# Patient Record
Sex: Male | Born: 1960 | Race: White | Hispanic: No | Smoking: Never smoker
Health system: Southern US, Community
[De-identification: ages and names within clinical notes are randomized; demographics above are authoritative.]

## PROBLEM LIST (undated history)

## (undated) DIAGNOSIS — T8859XA Other complications of anesthesia, initial encounter: Secondary | ICD-10-CM

## (undated) DIAGNOSIS — M1990 Unspecified osteoarthritis, unspecified site: Secondary | ICD-10-CM

## (undated) DIAGNOSIS — E119 Type 2 diabetes mellitus without complications: Secondary | ICD-10-CM

## (undated) DIAGNOSIS — F32A Depression, unspecified: Secondary | ICD-10-CM

## (undated) DIAGNOSIS — F909 Attention-deficit hyperactivity disorder, unspecified type: Secondary | ICD-10-CM

## (undated) DIAGNOSIS — E785 Hyperlipidemia, unspecified: Secondary | ICD-10-CM

## (undated) DIAGNOSIS — K219 Gastro-esophageal reflux disease without esophagitis: Secondary | ICD-10-CM

---

## 1999-03-04 ENCOUNTER — Encounter: Payer: Self-pay | Admitting: Emergency Medicine

## 1999-03-04 ENCOUNTER — Observation Stay (HOSPITAL_COMMUNITY): Admission: EM | Admit: 1999-03-04 | Discharge: 1999-03-05 | Payer: Self-pay | Admitting: Emergency Medicine

## 2005-05-27 ENCOUNTER — Encounter: Admission: RE | Admit: 2005-05-27 | Discharge: 2005-05-27 | Payer: Self-pay | Admitting: Specialist

## 2005-06-09 ENCOUNTER — Ambulatory Visit (HOSPITAL_BASED_OUTPATIENT_CLINIC_OR_DEPARTMENT_OTHER): Admission: RE | Admit: 2005-06-09 | Discharge: 2005-06-09 | Payer: Self-pay | Admitting: Orthopedic Surgery

## 2005-06-09 ENCOUNTER — Ambulatory Visit (HOSPITAL_COMMUNITY): Admission: RE | Admit: 2005-06-09 | Discharge: 2005-06-09 | Payer: Self-pay | Admitting: Orthopedic Surgery

## 2017-09-07 ENCOUNTER — Encounter (INDEPENDENT_AMBULATORY_CARE_PROVIDER_SITE_OTHER): Payer: Self-pay | Admitting: Orthopaedic Surgery

## 2017-09-07 ENCOUNTER — Ambulatory Visit (INDEPENDENT_AMBULATORY_CARE_PROVIDER_SITE_OTHER): Payer: Non-veteran care | Admitting: Orthopaedic Surgery

## 2017-09-07 DIAGNOSIS — M25511 Pain in right shoulder: Secondary | ICD-10-CM | POA: Diagnosis not present

## 2017-09-07 DIAGNOSIS — G8929 Other chronic pain: Secondary | ICD-10-CM | POA: Diagnosis not present

## 2017-09-07 MED ORDER — LIDOCAINE HCL 1 % IJ SOLN
3.0000 mL | INTRAMUSCULAR | Status: AC | PRN
  Administered 2017-09-07: 3 mL

## 2017-09-07 MED ORDER — BUPIVACAINE HCL 0.5 % IJ SOLN
3.0000 mL | INTRAMUSCULAR | Status: AC | PRN
  Administered 2017-09-07: 3 mL via INTRA_ARTICULAR

## 2017-09-07 MED ORDER — METHYLPREDNISOLONE ACETATE 40 MG/ML IJ SUSP
40.0000 mg | INTRAMUSCULAR | Status: AC | PRN
  Administered 2017-09-07: 40 mg via INTRA_ARTICULAR

## 2017-09-07 NOTE — Progress Notes (Signed)
Office Visit Note   Patient: Marcus Sanders           Date of Birth: [birth year]           MRN: [id number] Visit Date: 09/07/2017              Requested by: Artis DelayBaltzell, Jonathan R, PA-C [id number] [place], [place] [id number] PCP: Patient, No Pcp Per   Assessment & Plan: Visit Diagnoses:  1. Chronic right shoulder pain     Plan: Impression is right rotator cuff syndrome with symptomatic subscapularis tear that appears to be acute on chronic.  He is also symptomatic from the supraspinatus acute on chronic tear.  Subacromial injection was performed today.  Referral to physical therapy.  I think overall patient will be better served with conservative treatment as the MRI findings are more consistent of a chronic appearance.  Follow-Up Instructions: Return in about 6 weeks (around 10/19/2017).   Orders:  No orders of the defined types were placed in this encounter.  No orders of the defined types were placed in this encounter.     Procedures: Large Joint Inj: R subacromial bursa on 09/07/2017 9:39 AM Indications: pain Details: 22 G needle  Arthrogram: No  Medications: 3 mL lidocaine 1 %; 3 mL bupivacaine 0.5 %; 40 mg methylPREDNISolone acetate 40 MG/ML Outcome: tolerated well, no immediate complications Consent was given by the patient. Patient was prepped and draped in the usual sterile fashion.       Clinical Data: No additional findings.   Subjective: Chief Complaint  Patient presents with  . Right Shoulder - Pain    Patient is a 56 year old gentleman who comes in with right shoulder pain status post fall approximately 4 weeks ago.  He is has had history of right shoulder scope and rotator cuff repair in 2011.  He is now having pain with shoulder abduction and external rotation and pain on the front part of his shoulder and into the deltoid region.  Denies any numbness and tingling or radicular symptoms.    Review of Systems  Constitutional: Negative.     All other systems reviewed and are negative.    Objective: Vital Signs: There were no vitals taken for this visit.  Physical Exam  Constitutional: He is oriented to person, place, and time. He appears well-developed and well-nourished.  HENT:  Head: Normocephalic and atraumatic.  Eyes: Pupils are equal, round, and reactive to light.  Neck: Neck supple.  Pulmonary/Chest: Effort normal.  Abdominal: Soft.  Musculoskeletal: Normal range of motion.  Neurological: He is alert and oriented to person, place, and time.  Skin: Skin is warm.  Psychiatric: He has a normal mood and affect. His behavior is normal. Judgment and thought content normal.  Nursing note and vitals reviewed.   Ortho Exam Right shoulder exam shows well compensated rotator cuff function.  He does have pain with belly press and bear hug testing and he does have weakness.  Positive impingement signs.  Impression is right shoulder rotator cuff syndrome Specialty Comments:  No specialty comments available.  Imaging: No results found.   PMFS History: There are no active problems to display for this patient.  History reviewed. No pertinent past medical history.  History reviewed. No pertinent family history.  History reviewed. No pertinent surgical history. Social History   Occupational History  . Not on file  Tobacco Use  . Smoking status: Never Smoker  . Smokeless tobacco: Never Used  Substance and Sexual Activity  .  Alcohol use: Not on file  . Drug use: Not on file  . Sexual activity: Not on file

## 2017-09-28 ENCOUNTER — Telehealth (INDEPENDENT_AMBULATORY_CARE_PROVIDER_SITE_OTHER): Payer: Self-pay | Admitting: Orthopaedic Surgery

## 2017-09-28 NOTE — Telephone Encounter (Signed)
RECEIVED VM FROM JENNIFER W/ [facility] .REQUESTIING RECORDS BE FAXED. I FAXED RECORDS TO [phone], PH [phone], EXT [phone]

## 2017-10-09 ENCOUNTER — Telehealth (INDEPENDENT_AMBULATORY_CARE_PROVIDER_SITE_OTHER): Payer: Self-pay | Admitting: Orthopaedic Surgery

## 2017-10-09 NOTE — Telephone Encounter (Signed)
Patient called wanting to get a referral for PT.  [facility] is going through the [facility] and wants to know if he needs to schedule that himself or is this something that we can do for him. He also stated that he has been approved for 5 visits, but needs more than that. [phone]

## 2017-10-12 NOTE — Telephone Encounter (Signed)
Do you know what needs to be done. If he can schedule appt or.Marland Kitchen.?

## 2017-10-14 ENCOUNTER — Telehealth (INDEPENDENT_AMBULATORY_CARE_PROVIDER_SITE_OTHER): Payer: Self-pay | Admitting: Orthopaedic Surgery

## 2017-10-14 NOTE — Telephone Encounter (Signed)
error 

## 2017-10-16 ENCOUNTER — Ambulatory Visit (INDEPENDENT_AMBULATORY_CARE_PROVIDER_SITE_OTHER): Payer: Non-veteran care | Admitting: Orthopaedic Surgery

## 2017-10-16 DIAGNOSIS — G8929 Other chronic pain: Secondary | ICD-10-CM

## 2017-10-16 DIAGNOSIS — M25511 Pain in right shoulder: Secondary | ICD-10-CM

## 2017-10-16 NOTE — Addendum Note (Signed)
Addended by: Albertina ParrGARCIA, Mailani Degroote on: 10/16/2017 11:14 AM   Modules accepted: Orders

## 2017-10-16 NOTE — Progress Notes (Signed)
   Office Visit Note   Patient: Marcus Sanders           Date of Birth: [birth year]           MRN: [id number] Visit Date: 10/16/2017              Requested by: No referring provider defined for this encounter. PCP: Patient, No Pcp Per   Assessment & Plan: Visit Diagnoses:  1. Chronic right shoulder pain     Plan: MRI findings were again reviewed with the patient which essentially shows early rotator cuff arthropathy.  He does have atrophy of his rotator cuff muscle bellies.  At this point I think the best thing to do is to treat this conservatively with physical therapy and a shoulder injection hopefully give her some relief.  He is still quite active and too young for a reverse shoulder replacement.  I do not think his rotator cuff is fixable at this point.  Functionally speaking is very well compensated.  Questions encouraged and answered.  Follow-up as needed Total face to face encounter time was greater than 25 minutes and over half of this time was spent in counseling and/or coordination of care.  Follow-Up Instructions: Return if symptoms worsen or fail to improve.   Orders:  No orders of the defined types were placed in this encounter.  No orders of the defined types were placed in this encounter.     Procedures: No procedures performed   Clinical Data: No additional findings.   Subjective: No chief complaint on file.   Patient follows up today for his right shoulder pain.  Subacromial injection gave him approximately 20% relief.    Review of Systems  Constitutional: Negative.   All other systems reviewed and are negative.    Objective: Vital Signs: There were no vitals taken for this visit.  Physical Exam  Constitutional: He is oriented to person, place, and time. He appears well-developed and well-nourished.  Pulmonary/Chest: Effort normal.  Abdominal: Soft.  Neurological: He is alert and oriented to person, place, and time.  Skin: Skin is warm.    Psychiatric: He has a normal mood and affect. His behavior is normal. Judgment and thought content normal.  Nursing note and vitals reviewed.   Ortho Exam Right shoulder exam shows positive impingement signs.  Mildly positive bear hug and belly press.  Mildly positive empty can testing.  Overall rotator cuff function is well compensated. Specialty Comments:  No specialty comments available.  Imaging: No results found.   PMFS History: There are no active problems to display for this patient.  No past medical history on file.  No family history on file.  No past surgical history on file. Social History   Occupational History  . Not on file  Tobacco Use  . Smoking status: Never Smoker  . Smokeless tobacco: Never Used  Substance and Sexual Activity  . Alcohol use: Not on file  . Drug use: Not on file  . Sexual activity: Not on file

## 2017-11-06 ENCOUNTER — Telehealth (INDEPENDENT_AMBULATORY_CARE_PROVIDER_SITE_OTHER): Payer: Self-pay | Admitting: Physical Medicine and Rehabilitation

## 2017-11-06 ENCOUNTER — Ambulatory Visit (INDEPENDENT_AMBULATORY_CARE_PROVIDER_SITE_OTHER): Payer: No Typology Code available for payment source

## 2017-11-06 ENCOUNTER — Ambulatory Visit (INDEPENDENT_AMBULATORY_CARE_PROVIDER_SITE_OTHER): Payer: Non-veteran care | Admitting: Physical Medicine and Rehabilitation

## 2017-11-06 ENCOUNTER — Encounter (INDEPENDENT_AMBULATORY_CARE_PROVIDER_SITE_OTHER): Payer: Self-pay | Admitting: Physical Medicine and Rehabilitation

## 2017-11-06 DIAGNOSIS — G8929 Other chronic pain: Secondary | ICD-10-CM | POA: Diagnosis not present

## 2017-11-06 DIAGNOSIS — M25511 Pain in right shoulder: Secondary | ICD-10-CM | POA: Diagnosis not present

## 2017-11-06 MED ORDER — TRIAMCINOLONE ACETONIDE 40 MG/ML IJ SUSP
80.0000 mg | INTRAMUSCULAR | Status: AC | PRN
  Administered 2017-11-06: 80 mg via INTRA_ARTICULAR

## 2017-11-06 MED ORDER — BUPIVACAINE HCL 0.5 % IJ SOLN
3.0000 mL | INTRAMUSCULAR | Status: AC | PRN
  Administered 2017-11-06: 3 mL via INTRA_ARTICULAR

## 2017-11-06 NOTE — Patient Instructions (Signed)

## 2017-11-06 NOTE — Progress Notes (Deleted)
Pt states pain in right shoulder with some tingling in right hand. Pt states symptoms has been there since October 2018. Pt states gripping, washing hands, and washing hair makes pain worse. Pt states not moving makes pain better. -Dye Allergies.

## 2017-11-06 NOTE — Telephone Encounter (Signed)
Patient called asking if the office notes and PT order could [facility] at [phone]. If you have any questions just give him a call at [phone]

## 2017-11-06 NOTE — Progress Notes (Signed)
   Marcus Sanders - 57 y.o. male MRN [name]  Date of birth: May 12, 1961  Office Visit Note: Visit Date: 11/06/2017 PCP: Patient, No Pcp Per Referred by: No ref. provider found  Subjective: Chief Complaint  Patient presents with  . Right Shoulder - Pain  . Right Hand - Tingling   HPI: [name] is a 57 year old right-hand-dominant gentleman followed by Dr. Roda Sanders for chronic rotator cuff tear and shoulder glenohumeral joint arthrogram and injection.    ROS Otherwise per HPI.  Assessment & Plan: Visit Diagnoses:  1. Chronic right shoulder pain     Plan: Findings:  Diagnostic and hopefully therapeutic anesthetic has some relief anesthetic phase and some increased    Meds & Orders: No orders of the defined types were placed in this encounter.  No orders of the defined types were placed in this encounter.   Follow-up: No Follow-up on file.   Procedures: Large Joint Inj: R glenohumeral on 11/06/2017 9:57 AM Indications: pain and diagnostic evaluation Details: 22 G 3.5 in needle, anteromedial approach  Arthrogram: Yes  Medications: 80 mg triamcinolone acetonide 40 MG/ML; 3 mL bupivacaine 0.5 %  Arthrogram demonstrated excellent flow of contrast throughout the joint surface without extravasation or obvious defect.  The patient had relief of symptoms during the anesthetic phase of the injection.  Procedure, treatment alternatives, risks and benefits explained, specific risks discussed. Consent was given by the patient. Immediately prior to procedure a time out was called to verify the correct patient, procedure, equipment, support staff and site/side marked as required. Patient was prepped and draped in the usual sterile fashion.      No notes on file   Clinical History: No specialty comments available.  He reports that  has never smoked. he has never used smokeless tobacco. No results for input(s): HGBA1C, LABURIC in the last 8760 hours.  Objective:  VS:  HT:    WT:   BMI:      BP:   HR: bpm  TEMP: ( )  RESP:  Physical Exam  Ortho Exam Imaging: No results found.  Past Medical/Family/Surgical/Social History: Medications & Allergies reviewed per EMR There are no active problems to display for this patient.  History reviewed. No pertinent past medical history. History reviewed. No pertinent family history. History reviewed. No pertinent surgical history. Social History   Occupational History  . Not on file  Tobacco Use  . Smoking status: Never Smoker  . Smokeless tobacco: Never Used  Substance and Sexual Activity  . Alcohol use: Not on file  . Drug use: Not on file  . Sexual activity: Not on file

## 2017-11-09 NOTE — Telephone Encounter (Signed)
FAXED TO [phone]

## 2017-11-09 NOTE — Telephone Encounter (Signed)
I think this one is a [facility] patient.

## 2017-11-30 ENCOUNTER — Telehealth (INDEPENDENT_AMBULATORY_CARE_PROVIDER_SITE_OTHER): Payer: Self-pay | Admitting: Orthopaedic Surgery

## 2017-11-30 NOTE — Telephone Encounter (Signed)
09/07/2017 OV NOTE FAXED [facility] [phone]

## 2018-05-10 ENCOUNTER — Emergency Department (HOSPITAL_COMMUNITY): Payer: Non-veteran care

## 2018-05-10 ENCOUNTER — Emergency Department (HOSPITAL_COMMUNITY)
Admission: EM | Admit: 2018-05-10 | Discharge: 2018-05-10 | Disposition: A | Discharge status: Home / Self Care | Payer: Non-veteran care | Attending: Emergency Medicine | Admitting: Emergency Medicine

## 2018-05-10 ENCOUNTER — Other Ambulatory Visit: Payer: Self-pay

## 2018-05-10 ENCOUNTER — Encounter (HOSPITAL_COMMUNITY): Payer: Self-pay | Admitting: Emergency Medicine

## 2018-05-10 DIAGNOSIS — E11628 Type 2 diabetes mellitus with other skin complications: Secondary | ICD-10-CM

## 2018-05-10 DIAGNOSIS — L089 Local infection of the skin and subcutaneous tissue, unspecified: Secondary | ICD-10-CM

## 2018-05-10 DIAGNOSIS — E11621 Type 2 diabetes mellitus with foot ulcer: Secondary | ICD-10-CM | POA: Diagnosis not present

## 2018-05-10 DIAGNOSIS — L03115 Cellulitis of right lower limb: Secondary | ICD-10-CM | POA: Diagnosis not present

## 2018-05-10 DIAGNOSIS — Z7982 Long term (current) use of aspirin: Secondary | ICD-10-CM | POA: Insufficient documentation

## 2018-05-10 DIAGNOSIS — L97511 Non-pressure chronic ulcer of other part of right foot limited to breakdown of skin: Secondary | ICD-10-CM | POA: Diagnosis not present

## 2018-05-10 HISTORY — DX: Type 2 diabetes mellitus without complications: E11.9

## 2018-05-10 LAB — COMPREHENSIVE METABOLIC PANEL
ALT: 26 U/L (ref 0–44)
AST: 14 U/L — AB (ref 15–41)
Albumin: 4 g/dL (ref 3.5–5.0)
Alkaline Phosphatase: 92 U/L (ref 38–126)
Anion gap: 9 (ref 5–15)
BUN: 17 mg/dL (ref 6–20)
CHLORIDE: 104 mmol/L (ref 98–111)
CO2: 26 mmol/L (ref 22–32)
CREATININE: 0.83 mg/dL (ref 0.61–1.24)
Calcium: 9.3 mg/dL (ref 8.9–10.3)
GFR calc Af Amer: >60 mL/min (ref >60)
GFR calc non Af Amer: >60 mL/min (ref >60)
Glucose, Bld: 128 mg/dL — ABNORMAL HIGH (ref 70–99)
Potassium: 4.2 mmol/L (ref 3.5–5.1)
SODIUM: 139 mmol/L (ref 135–145)
Total Bilirubin: 0.5 mg/dL (ref 0.3–1.2)
Total Protein: 6.9 g/dL (ref 6.5–8.1)

## 2018-05-10 LAB — CBC WITH DIFFERENTIAL/PLATELET
Abs Immature Granulocytes: 0 10*3/uL (ref 0.0–0.1)
Basophils Absolute: 0 10*3/uL (ref 0.0–0.1)
Basophils Relative: 0 %
EOS ABS: 0.3 10*3/uL (ref 0.0–0.7)
EOS PCT: 3 %
HEMATOCRIT: 43.1 % (ref 39.0–52.0)
HEMOGLOBIN: 14.3 g/dL (ref 13.0–17.0)
IMMATURE GRANULOCYTES: 0 %
LYMPHS ABS: 1 10*3/uL (ref 0.7–4.0)
Lymphocytes Relative: 10 %
MCH: 30.8 pg (ref 26.0–34.0)
MCHC: 33.2 g/dL (ref 30.0–36.0)
MCV: 92.7 fL (ref 78.0–100.0)
MONOS PCT: 10 %
Monocytes Absolute: 1 10*3/uL (ref 0.1–1.0)
NEUTROS PCT: 77 %
Neutro Abs: 7.4 10*3/uL (ref 1.7–7.7)
Platelets: 363 10*3/uL (ref 150–400)
RBC: 4.65 MIL/uL (ref 4.22–5.81)
RDW: 13.3 % (ref 11.5–15.5)
WBC: 9.7 10*3/uL (ref 4.0–10.5)

## 2018-05-10 LAB — I-STAT CG4 LACTIC ACID, ED: LACTIC ACID, VENOUS: 1.08 mmol/L (ref 0.5–1.9)

## 2018-05-10 MED ORDER — AMOXICILLIN-POT CLAVULANATE 875-125 MG PO TABS
1.0000 | ORAL_TABLET | Freq: Once | ORAL | Status: AC
  Administered 2018-05-10: 1 via ORAL
  Filled 2018-05-10: qty 1

## 2018-05-10 MED ORDER — SULFAMETHOXAZOLE-TRIMETHOPRIM 800-160 MG PO TABS: 2.0000 | ORAL_TABLET | Freq: Two times a day (BID) | ORAL | 0 refills | Status: AC

## 2018-05-10 MED ORDER — SULFAMETHOXAZOLE-TRIMETHOPRIM 800-160 MG PO TABS
2.0000 | ORAL_TABLET | Freq: Once | ORAL | Status: AC
  Administered 2018-05-10: 2 via ORAL
  Filled 2018-05-10: qty 2

## 2018-05-10 MED ORDER — AMOXICILLIN-POT CLAVULANATE 875-125 MG PO TABS: 1.0000 | ORAL_TABLET | Freq: Two times a day (BID) | ORAL | 0 refills | Status: AC

## 2018-05-10 NOTE — Discharge Instructions (Signed)
Keep foot and toe clean and dry. Change dressing around 1-2 times daily. Inspect between toes daily. Return if redness expanding beyond marked margins.

## 2018-05-10 NOTE — ED Triage Notes (Signed)
Patient complains of infection to right foot, history of diabetes. Patient unsure of initial cause of wound. Right foot is red and swollen. Patient states it feels like a sunburn, history of neuropathy. Patient alert, oriented, and ambulating independently with steady gait.

## 2018-05-10 NOTE — ED Provider Notes (Signed)
[facility] EMERGENCY [facility] Provider Note   CSN: [id number] Arrival date & time: 05/10/18  1154  History   Chief Complaint Chief Complaint  Patient presents with  . 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 He states he had onset of redness and pain to the dorsum of the right foot 2 days ago which has progressively worsened. This morning he awoke with wound to the inner surface of the right third toe. He is not currently on any antibiotics. No fever, vomiting, or other recent illness. Adherent to diabetic regimen. No other complaints.  Past Medical History:  Diagnosis Date  . Diabetes mellitus without complication (HCC)     There are no active problems to display for this patient.   History reviewed. No pertinent surgical history.      Home Medications    Prior to Admission medications   Medication Sig Start Date End Date Taking? Authorizing Provider  amoxicillin-clavulanate (AUGMENTIN) 875-125 MG tablet Take 1 tablet by mouth every 12 (twelve) hours for 14 days. 05/11/18 05/25/18  Cecille PoMacklin, Daylynn Stumpp W, MD  aspirin 325 MG tablet Take 325 mg daily by mouth.    [provider]  OMEPRAZOLE PO Take by mouth.    [provider]  sulfamethoxazole-trimethoprim (BACTRIM DS,SEPTRA DS) 800-160 MG tablet Take 2 tablets by mouth 2 (two) times daily for 14 days. 05/11/18 05/25/18  Cecille PoMacklin, Tally Mattox W, MD    Family History No family history on file.  Social History Social History   Tobacco Use  . Smoking status: Never Smoker  . Smokeless tobacco: Never Used  Substance Use Topics  . Alcohol use: Not on file  . Drug use: Not on file     Allergies   Patient has no known allergies.   Review of Systems Review of Systems  Constitutional: Negative for fever.  HENT: Negative for congestion.   Eyes: Negative for visual  disturbance.  Respiratory: Negative for cough and shortness of breath.   Cardiovascular: Negative for chest pain.  Gastrointestinal: Negative for abdominal pain, diarrhea and vomiting.  Genitourinary: Negative for dysuria.  Musculoskeletal: Negative for back pain.  Skin: Positive for rash and wound.  Neurological: Negative for headaches.  All other systems reviewed and are negative.    Physical Exam Updated Vital Signs BP 139/84 (BP Location: Right Arm)   Pulse 66   Temp (!) 97.5 F (36.4 C) (Oral)   Resp 16   SpO2 100%   Physical Exam  Constitutional: He is oriented to person, place, and time. No distress.  HENT:  Head: Normocephalic and atraumatic.  Mouth/Throat: Oropharynx is clear and moist.  Eyes: Pupils are equal, round, and reactive to light.  Neck: Neck supple. No JVD present.  Cardiovascular: Normal rate, regular rhythm, normal heart sounds and intact distal pulses.  No murmur heard. Pulmonary/Chest: Breath sounds normal. No respiratory distress. He has no wheezes. He has no rales.  Abdominal: Soft. He exhibits no distension and no mass. There is no tenderness. There is no guarding.  Musculoskeletal: Normal range of motion. He exhibits no edema.  Neurological: He is alert and oriented to person, place, and time.  Skin: Skin is warm and dry.  There is erythema beginning at the right second toe interspace extending proximally over the dorsum of the right foot with increased warmth and mild tenderness. There is an small area of  pale loose skin over the medial aspect of the right third toe from which clear fluid can be expressed and small area of superficial wound. No obvious bony depth on manual exploration. DP pulses are 1+ and symmetric.  Psychiatric: He has a normal mood and affect. His behavior is normal.  Nursing note and vitals reviewed.    ED Treatments / Results  Labs (all labs ordered are listed, but only abnormal results are displayed) Labs Reviewed    COMPREHENSIVE METABOLIC PANEL - Abnormal; Notable for the following components:      Result Value   Glucose, Bld 128 (*)    AST 14 (*)    All other components within normal limits  CBC WITH DIFFERENTIAL/PLATELET  I-STAT CG4 LACTIC ACID, ED    EKG None  Radiology Dg Foot Complete Right  Result Date: 05/10/2018 CLINICAL DATA:  Patient complains of infection to right foot, history of diabetes. Redness and swelling to right third toe for 2-3 days. EXAM: RIGHT FOOT COMPLETE - 3+ VIEW COMPARISON:  None. 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 No osseous erosion to suggest osteomyelitis. THREE linear metallic foreign bodies in line with the base the second and third metatarsals and tarsal bones. These measure approximately 5 TO 8 mm. IMPRESSION: 1. No evidence of osteomyelitis. 2. Linear foreign bodies in the midfoot may represent needle fragments. 3. No clear acute findings. Electronically Signed   By: Genevive Bi M.D.   On: 05/10/2018 18:55    Procedures Procedures (including critical care time)  Medications Ordered in ED Medications  sulfamethoxazole-trimethoprim (BACTRIM DS,SEPTRA DS) 800-160 MG per tablet 2 tablet (2 tablets Oral Given 05/10/18 2014)  amoxicillin-clavulanate (AUGMENTIN) 875-125 MG per tablet 1 tablet (1 tablet Oral Given 05/10/18 2013)     Initial Impression / Assessment and Plan / ED Course  I have reviewed the triage vital signs and the nursing notes.  Pertinent labs & imaging results that were available during my care of the patient were reviewed by me and considered in my medical decision making (see chart for details).  This is a diabetic male presenting for right foot infection as above. Clinical picture was consistent with infected wound with cellulitis. There is no lymphangitic streaking or signs of systemic illness. Screening labs are reassuring with mild hyperglycemia consistent with his report of well-controlled diabetes  and no leukocytosis. Plain films show no evidence of osteomyelitis. It does show possible retained needle fragments which patient states are old when he is unsure of their origin. He denies history of IVDA. Therefore no signs of sepsis or osteomyelitis at this time.  Chief decision making the engage with patient for his cellulitis and wound. He prefers discharge and follow-up for recheck which I think is reasonable. Margins of erythema were marked and importance of wound care was discussed. Strict return precautions reviewed. All questions answered.  Final Clinical Impressions(s) / ED Diagnoses   Final diagnoses:  Cellulitis of right lower extremity  Diabetic foot infection [facility])    ED Discharge Orders        Ordered    amoxicillin-clavulanate (AUGMENTIN) 875-125 MG tablet  Every 12 hours     05/10/18 2001    sulfamethoxazole-trimethoprim (BACTRIM DS,SEPTRA DS) 800-160 MG tablet  2 times daily     05/10/18 2001       Cecille Po, MD 05/10/18 2118    Gwyneth Sprout, MD 05/11/18 [phone]

## 2018-10-04 ENCOUNTER — Other Ambulatory Visit: Payer: Self-pay | Admitting: Surgery

## 2018-10-04 DIAGNOSIS — L03031 Cellulitis of right toe: Secondary | ICD-10-CM

## 2018-10-06 ENCOUNTER — Other Ambulatory Visit: Payer: Self-pay | Admitting: Surgical

## 2018-10-06 DIAGNOSIS — L03031 Cellulitis of right toe: Secondary | ICD-10-CM

## 2018-10-12 ENCOUNTER — Ambulatory Visit
Admission: RE | Admit: 2018-10-12 | Discharge: 2018-10-12 | Disposition: A | Discharge status: Home / Self Care | Payer: No Typology Code available for payment source | Source: Ambulatory Visit | Attending: Surgical | Admitting: Surgical

## 2018-10-12 DIAGNOSIS — L03031 Cellulitis of right toe: Secondary | ICD-10-CM

## 2019-12-14 ENCOUNTER — Other Ambulatory Visit: Payer: Self-pay

## 2019-12-14 ENCOUNTER — Ambulatory Visit: Payer: No Typology Code available for payment source | Attending: Neurosurgery | Admitting: Physical Therapy

## 2019-12-14 ENCOUNTER — Encounter: Payer: Self-pay | Admitting: Physical Therapy

## 2019-12-14 DIAGNOSIS — G8929 Other chronic pain: Secondary | ICD-10-CM | POA: Diagnosis present

## 2019-12-14 DIAGNOSIS — M5441 Lumbago with sciatica, right side: Secondary | ICD-10-CM | POA: Diagnosis not present

## 2019-12-14 DIAGNOSIS — R293 Abnormal posture: Secondary | ICD-10-CM | POA: Diagnosis present

## 2019-12-14 DIAGNOSIS — M62830 Muscle spasm of back: Secondary | ICD-10-CM | POA: Diagnosis present

## 2019-12-15 ENCOUNTER — Encounter: Payer: Self-pay | Admitting: Physical Therapy

## 2019-12-15 NOTE — Therapy (Signed)
[facility] [address] [place], [place], [address] Phone: [phone]   Fax:  [phone]  Physical Therapy Evaluation  Patient Details  Name: Marcus Sanders MRN: [id number] Date of Birth: 1961/10/09 Referring Provider (PT): Hoyt Koch MD    Encounter Date: 12/14/2019  PT End of Session - 12/15/19 0820    Visit Number  1    Number of Visits  15    Date for PT Re-Evaluation  02/09/20    Authorization Type  15 visits authorized by [facility]    PT Start Time  1015    PT Stop Time  1058    PT Time Calculation (min)  43 min    Activity Tolerance  Patient tolerated treatment well    Behavior During Therapy  [facility] for tasks assessed/performed       Past Medical History:  Diagnosis Date  . Diabetes mellitus without complication (HCC)     History reviewed. No pertinent surgical history.  There were no vitals filed for this visit.   Subjective Assessment - 12/14/19 1022    Subjective  Patient has been having low back pain for about a year and a half that raidates into the lumbar spine. The pain is worse when stands. When he is walking and moving it isn;t as bad.    Limitations  Standing;Walking    How long can you walk comfortably?  does not cause    Currently in Pain?  Yes    Pain Score  6     Pain Location  Back    Pain Orientation  Right    Pain Descriptors / Indicators  Aching    Pain Type  Chronic pain    Pain Radiating Towards  raidating into the right buttock    Pain Onset  More than a month ago    Pain Frequency  Intermittent    Aggravating Factors   standing    Pain Relieving Factors  sitting, walking         [facility] PT Assessment - 12/15/19 0001      Assessment   Medical Diagnosis  Low Back Pain     Referring Provider (PT)  Hoyt Koch MD     Next MD Visit  March 9th     Prior Therapy  for his shoulder       Precautions   Precautions  None      Restrictions   Weight Bearing Restrictions  No      Balance Screen   Has the patient fallen in the past 6 months  No    Has the patient had a decrease in activity level because of a fear of falling?   No    Is the patient reluctant to leave their home because of a fear of falling?   No      Home Environment   Additional Comments  nothing pertinant       Prior Function   Level of Independence  Independent    Vocation  Full time employment    Vocation Requirements  works in [employer]    Leisure  bowling       Cognition   Overall Cognitive Status  Within Functional Limits for tasks assessed    Attention  Focused      Observation/Other Assessments   Focus on Therapeutic Outcomes (FOTO)   set up for shoulder. Time did not permit for front desk to change       Sensation   Light  Touch  Appears Intact    Additional Comments  radiating down into right buttock       Coordination   Gross Motor Movements are Fluid and Coordinated  Yes    Fine Motor Movements are Fluid and Coordinated  Yes      Posture/Postural Control   Posture Comments  slight trunk flexion in standing       ROM / Strength   AROM / PROM / Strength  AROM;PROM;Strength      AROM   AROM Assessment Site  Lumbar    Lumbar Flexion  no limit; pull felt at end range     Lumbar Extension  No reporduction of symptoms with extension despite walking in flexion     Lumbar - Right Side Bend  normal     Lumbar - Left Side Bend  normal     Lumbar - Right Rotation  mild pain at end range     Lumbar - Left Rotation  pain at end range       PROM   Overall PROM Comments  mild hip flexion limitations but no increase in pain       Strength   Overall Strength Comments  gross bilateral LE strength 5/5     Strength Assessment Site  Hip;Knee      Flexibility   Soft Tissue Assessment /Muscle Length  yes    Hamstrings  90/90 hamstring R-40 left -30       Palpation   Palpation comment  spasming into bilateral lumbar spine and gluteals R> L; tenderenss to palpation with deep palpation of  the right glutreal towards the pirifromis       Special Tests   Other special tests  SLR (-) bilateral       Ambulation/Gait   Gait Comments  ambualtes with trunk flexion and decreased bilateral hip flexion                 Objective measurements completed on examination: See above findings.      [place] Adult PT Treatment/Exercise - 12/15/19 0001      Lumbar Exercises: Stretches   Active Hamstring Stretch Limitations  seated with cuing for posture    Piriformis Stretch Limitations  2x20 sec bilateral in seated and supine; shown for work     Other Lumbar Stretch Exercise  tennis ball trigger point releease to prifiromis and lumbar musculature       Manual Therapy   Manual therapy comments  LAD 2x30 sec hold bilateral; caviation and relife felt              PT Education - 12/15/19 0819    Education Details  reviewed the improtance of stretching    Person(s) Educated  Patient    Methods  Explanation;Demonstration;Tactile cues;Verbal cues    Comprehension  Verbalized understanding;Returned demonstration;Verbal cues required;Tactile cues required       PT Short Term Goals - 12/15/19 0825      PT SHORT TERM GOAL #1   Title  Patient will increase90/90 hamstring length by 20 degrees    Time  3    Period  Weeks    Status  New    Target Date  01/05/20      PT SHORT TERM GOAL #2   Title  Patient will report no radiating pain down his right leg    Time  3    Period  Weeks    Status  New    Target Date  01/05/20  PT SHORT TERM GOAL #3   Title  Patient will independent with basci stretching and strengthening program  program    Time  3    Period  Weeks    Status  New    Target Date  01/05/20        PT Long Term Goals - 12/15/19 0827      PT LONG TERM GOAL #1   Title  Patient will stand for 1 hour at work without self treported pain    Time  8    Period  Weeks    Target Date  02/09/20      PT LONG TERM GOAL #2   Title  Patient will sit in tight  spaces at work without selft report of increased low back stiffness    Time  8    Period  Weeks    Status  New    Target Date  02/09/20             Plan - 12/14/19 1358    Clinical Impression Statement  Patient is a 59 year old male with low back pain that is worse when he stands and radiates into his right buttock and posterior thigh at times. 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 He has to work in tight spaces at work. He has significant spasming in his right gluteal and lumbar spine.    Personal Factors and Comorbidities  Comorbidity 1;Comorbidity 2    Comorbidities  peripheral neuropathy; right pinkie toe amputation    Examination-Activity Limitations  Stand    Examination-Participation Restrictions  Meal Prep   work   Stability/Clinical Decision Making  Evolving/Moderate complexity   decreasing ability to stand at work   Clinical Decision Making  Low    Rehab Potential  Excellent    PT Frequency  2x / week    PT Duration  8 weeks   8 approved by [facility]   PT Treatment/Interventions  ADLs/Self Care Home Management;Cryotherapy;Electrical Stimulation;Ultrasound;Moist Heat;Functional mobility training;Therapeutic activities;Therapeutic exercise;Neuromuscular re-education;Patient/family education;Manual techniques;Dry needling;Passive range of motion;Iontophoresis 4mg /ml Dexamethasone;Spinal Manipulations    PT Next Visit Plan  Patient has limited PA mobbility of lumbar spina and spasming of his lumbar spine and gluteals. When he has pain he feels it in his piriformis. he may benefti from needling to lumbar spine, gluts and piriformis; review strathing, may benefit from core strengthening; consider spine nuetral core strengthening;    PT Home Exercise Plan  hamstring stretch; tennis ball trigger point release, piriformins stretch    Consulted and Agree with Plan of Care  Patient       Patient will benefit from skilled therapeutic intervention in order to improve  the following deficits and impairments:  Abnormal gait, Decreased endurance, Pain, Postural dysfunction, Decreased range of motion  Visit Diagnosis: Chronic right-sided low back pain with right-sided sciatica - Plan: PT plan of care cert/re-cert  Muscle spasm of back - Plan: PT plan of care cert/re-cert  Abnormal posture - Plan: PT plan of care cert/re-cert     Problem List There are no problems to display for this patient.   PT DPT  12/15/2019, 8:41 AM  [facility] [address] [place], [place], [place] Phone: [phone]   Fax:  [phone]  Name: Marcus Sanders MRN: [name] Date of Birth: 01/17/1961

## 2019-12-22 ENCOUNTER — Encounter: Payer: Self-pay | Admitting: Physical Therapy

## 2019-12-22 ENCOUNTER — Ambulatory Visit: Payer: No Typology Code available for payment source | Admitting: Physical Therapy

## 2019-12-22 ENCOUNTER — Other Ambulatory Visit: Payer: Self-pay

## 2019-12-22 DIAGNOSIS — M5441 Lumbago with sciatica, right side: Secondary | ICD-10-CM | POA: Diagnosis not present

## 2019-12-22 DIAGNOSIS — M62830 Muscle spasm of back: Secondary | ICD-10-CM

## 2019-12-22 DIAGNOSIS — R293 Abnormal posture: Secondary | ICD-10-CM

## 2019-12-22 DIAGNOSIS — G8929 Other chronic pain: Secondary | ICD-10-CM

## 2019-12-22 NOTE — Therapy (Signed)
[place], [place], [address] Phone: [phone]   Fax:  [phone]  Physical Therapy Treatment  Patient Details  Name: Marcus Sanders MRN: [id number] Date of Birth: July 11, 1961 Referring Provider (PT): Duffy Rhody MD    Encounter Date: 12/22/2019  PT End of Session - 12/22/19 1701    Visit Number  2    Number of Visits  15    Date for PT Re-Evaluation  02/09/20    Authorization Type  15 visits authorized by [place] - Visit Number  2    Authorization - Number of Visits  15    PT Start Time  1620    PT Stop Time  1706    PT Time Calculation (min)  46 min    Activity Tolerance  Patient tolerated treatment well    Behavior During Therapy  [facility] for tasks assessed/performed       Past Medical History:  Diagnosis Date  . Diabetes mellitus without complication ([place])     History reviewed. No pertinent surgical history.  There were no vitals filed for this visit.  Subjective Assessment - 12/22/19 1646    Subjective  Pt. continues with leg symptoms with standing and walking. Local back pain minimal today.    Currently in Pain?  Yes    Pain Score  3     Pain Location  Back    Pain Descriptors / Indicators  Aching    Pain Type  Chronic pain    Pain Radiating Towards  right buttock, legs feel tired/fatigued    Pain Onset  More than a month ago    Pain Frequency  Intermittent    Aggravating Factors   standing, prolonged walking    Pain Relieving Factors  sitting    Effect of Pain on Daily Activities  limits standing and walking tolerance                       [facility] Adult PT Treatment/Exercise - 12/22/19 0001      Exercises   Exercises  Lumbar      Lumbar Exercises: Stretches   Passive Hamstring Stretch  Right;Left;2 reps;30 seconds    Single Knee to Chest Stretch  Right;Left;3 reps;10 seconds    Piriformis Stretch  Right;Left;2 reps;30 seconds      Lumbar Exercises:  Supine   Pelvic Tilt  15 reps    Bent Knee Raise  15 reps    Bridge  15 reps      Manual Therapy   Manual Therapy  Joint mobilization;Soft tissue mobilization    Joint Mobilization  LAD bilat. hips grade I-IV oscillations, lumbar PAs grade I-IV, lumbar side glides grade I-IV bilat. in sidelying    Soft tissue mobilization  STM lumbar paraspinals, IASTM/roller use use bilateral gluts, hamstrings, piriformis             PT Education - 12/22/19 1701    Education Details  spinal anatomy, POC, HEP    Person(s) Educated  Patient    Methods  Explanation;Demonstration;Verbal cues    Comprehension  Verbalized understanding;Returned demonstration       PT Short Term Goals - 12/15/19 0825      PT SHORT TERM GOAL #1   Title  Patient will increase90/90 hamstring length by 20 degrees    Time  3    Period  Weeks    Status  New    Target Date  01/05/20      PT SHORT TERM GOAL #2   Title  Patient will report no radiating pain down his right leg    Time  3    Period  Weeks    Status  New    Target Date  01/05/20      PT SHORT TERM GOAL #3   Title  Patient will independent with basci stretching and strengthening program  program    Time  3    Period  Weeks    Status  New    Target Date  01/05/20        PT Long Term Goals - 12/15/19 0827      PT LONG TERM GOAL #1   Title  Patient will stand for 1 hour at work without self treported pain    Time  8    Period  Weeks    Target Date  02/09/20      PT LONG TERM GOAL #2   Title  Patient will sit in tight spaces at work without selft report of increased low back stiffness    Time  8    Period  Weeks    Status  New    Target Date  02/09/20            Plan - 12/22/19 1702    Clinical Impression Statement  Tx. focus exercises for stretches and flexion bias ROM also with extensive manual focus for spinal and hip mobs as well as STM. Symptoms consistent with underlying spina stenosis. Given symptom etiology and chronicity  expect potential progress will be gradual but session well tolerated with good response to both manual and exercises    Personal Factors and Comorbidities  Comorbidity 1;Comorbidity 2    Comorbidities  peripheral neuropathy; right pinkie toe amputation    Examination-Activity Limitations  Stand    Examination-Participation Restrictions  Meal Prep    Stability/Clinical Decision Making  Evolving/Moderate complexity    Clinical Decision Making  Low    Rehab Potential  Excellent    PT Frequency  2x / week    PT Duration  8 weeks    PT Treatment/Interventions  ADLs/Self Care Home Management;Cryotherapy;Electrical Stimulation;Ultrasound;Moist Heat;Functional mobility training;Therapeutic activities;Therapeutic exercise;Neuromuscular re-education;Patient/family education;Manual techniques;Dry needling;Passive range of motion;Iontophoresis 4mg /ml Dexamethasone;Spinal Manipulations    PT Next Visit Plan  Lumbar PAs,hip distraction, STM parapsinals and gluts/piriformis, flexion bias ROM and hamstring + hip stretches, potential consideration of trial dry needling at future visist as appropriate    PT Home Exercise Plan  hamstring stretch; tennis ball trigger point release, piriformins stretch    Consulted and Agree with Plan of Care  Patient       Patient will benefit from skilled therapeutic intervention in order to improve the following deficits and impairments:  Abnormal gait, Decreased endurance, Pain, Postural dysfunction, Decreased range of motion  Visit Diagnosis: Chronic right-sided low back pain with right-sided sciatica  Muscle spasm of back  Abnormal posture     Problem List There are no problems to display for this patient.   , PT, DPT 12/22/19 5:07 PM  [facility] [address] [place], [place], [place] Phone: [phone]   Fax:  [phone]  Name: Marcus Sanders MRN: [name] Date of Birth:  Mar 02, 1961

## 2019-12-26 ENCOUNTER — Ambulatory Visit: Payer: No Typology Code available for payment source | Attending: Neurosurgery | Admitting: Physical Therapy

## 2019-12-26 ENCOUNTER — Encounter: Payer: Self-pay | Admitting: Physical Therapy

## 2019-12-26 ENCOUNTER — Encounter: Payer: No Typology Code available for payment source | Admitting: Physical Therapy

## 2019-12-26 ENCOUNTER — Other Ambulatory Visit: Payer: Self-pay

## 2019-12-26 DIAGNOSIS — M5441 Lumbago with sciatica, right side: Secondary | ICD-10-CM | POA: Diagnosis not present

## 2019-12-26 DIAGNOSIS — R293 Abnormal posture: Secondary | ICD-10-CM | POA: Insufficient documentation

## 2019-12-26 DIAGNOSIS — G8929 Other chronic pain: Secondary | ICD-10-CM | POA: Diagnosis present

## 2019-12-26 DIAGNOSIS — M62830 Muscle spasm of back: Secondary | ICD-10-CM | POA: Diagnosis present

## 2019-12-26 NOTE — Therapy (Signed)
[place], [place], [address] Phone: [phone]   Fax:  [phone]  Physical Therapy Treatment  Patient Details  Name: Marcus Sanders MRN: [id number] Date of Birth: [birth year] Referring Provider (PT): Duffy Rhody MD    Encounter Date: 12/26/2019  PT End of Session - 12/26/19 1529    Visit Number  3    Number of Visits  15    Date for PT Re-Evaluation  02/09/20    Authorization Type  15 visits authorized by [place] - Visit Number  2    Authorization - Number of Visits  15    PT Start Time  1140    PT Stop Time  1228    PT Time Calculation (min)  48 min    Activity Tolerance  Patient tolerated treatment well    Behavior During Therapy  [facility] for tasks assessed/performed       Past Medical History:  Diagnosis Date  . Diabetes mellitus without complication ([place])     History reviewed. No pertinent surgical history.  There were no vitals filed for this visit.  Subjective Assessment - 12/26/19 1526    Subjective  Patient continues to have significant pain in his back. He feels like it may be a little better with walking but yesterday his back pain was significant. He has to do a lot of walking on his knees at work.    Limitations  Standing;Walking    How long can you walk comfortably?  does not cause    Currently in Pain?  Yes    Pain Score  5     Pain Location  Back    Pain Orientation  Right;Left    Pain Descriptors / Indicators  Aching    Pain Type  Chronic pain    Pain Radiating Towards  into both legs    Pain Onset  More than a month ago    Pain Frequency  Intermittent    Aggravating Factors   standing and prolonged walking    Pain Relieving Factors  sitting    Effect of Pain on Daily Activities  limits standing and work tolerance                       [facility] Adult PT Treatment/Exercise - 12/26/19 0001      Lumbar Exercises: Stretches   Passive Hamstring  Stretch  Right;Left;2 reps;30 seconds    Single Knee to Chest Stretch  Right;Left;3 reps;10 seconds    Piriformis Stretch  Right;Left;2 reps;30 seconds      Lumbar Exercises: Supine   Pelvic Tilt  15 reps    Pelvic Tilt Limitations  reviewed with breathing     Bent Knee Raise  15 reps    Bridge  15 reps      Manual Therapy   Manual Therapy  Joint mobilization;Soft tissue mobilization    Joint Mobilization  LAD bilat. hips grade I-IV oscillations, lumbar PAs grade I-IV, lumbar side glides grade I-IV bilat. in sidelying    Soft tissue mobilization  STM lumbar paraspinals, IASTM/roller use use bilateral gluts, hamstrings, piriformis             PT Education - 12/26/19 1528    Education Details  reviewed core breathing and self soft tissue mobilization    Person(s) Educated  Patient    Methods  Explanation;Verbal cues;Tactile cues;Demonstration    Comprehension  Returned demonstration;Verbal cues required;Tactile cues required;Verbalized  understanding       PT Short Term Goals - 12/26/19 1535      PT SHORT TERM GOAL #1   Title  Patient will increase90/90 hamstring length by 20 degrees    Time  3    Period  Weeks    Status  On-going    Target Date  01/05/20      PT SHORT TERM GOAL #2   Title  Patient will report no radiating pain down his right leg    Time  3    Period  Weeks    Status  On-going      PT SHORT TERM GOAL #3   Title  Patient will independent with basci stretching and strengthening program  program    Time  3    Status  On-going    Target Date  01/05/20        PT Long Term Goals - 12/15/19 0827      PT LONG TERM GOAL #1   Title  Patient will stand for 1 hour at work without self treported pain    Time  8    Period  Weeks    Target Date  02/09/20      PT LONG TERM GOAL #2   Title  Patient will sit in tight spaces at work without selft report of increased low back stiffness    Time  8    Period  Weeks    Status  New    Target Date  02/09/20             Plan - 12/26/19 1529    Clinical Impression Statement  Patient continues to have significant spasming in his QL, lu,bar paraspinals and gluteals. He reported last visit the manual therapy helped for several days but thne the pain came back. He flet the PA glides helped with his spinal mobility. He was given ther-ex and reviewed breathing. He was interested in what to dso at the gy,. He was advised what not to do and how to incorperate breathing into his gym program. Therapy will progress patient into machines at the gym.    Personal Factors and Comorbidities  Comorbidity 1;Comorbidity 2    Comorbidities  peripheral neuropathy; right pinkie toe amputation    Examination-Activity Limitations  Stand    Examination-Participation Restrictions  Meal Prep    Stability/Clinical Decision Making  Evolving/Moderate complexity    Rehab Potential  Excellent    PT Frequency  2x / week    PT Duration  8 weeks    PT Treatment/Interventions  ADLs/Self Care Home Management;Cryotherapy;Electrical Stimulation;Ultrasound;Moist Heat;Functional mobility training;Therapeutic activities;Therapeutic exercise;Neuromuscular re-education;Patient/family education;Manual techniques;Dry needling;Passive range of motion;Iontophoresis 4mg /ml Dexamethasone;Spinal Manipulations    PT Next Visit Plan  Lumbar PAs,hip distraction, STM parapsinals and gluts/piriformis, flexion bias ROM and hamstring + hip stretches, potential consideration of trial dry needling at future visist as appropriate    PT Home Exercise Plan  hamstring stretch; tennis ball trigger point release, piriformins stretch    Consulted and Agree with Plan of Care  Patient       Patient will benefit from skilled therapeutic intervention in order to improve the following deficits and impairments:  Abnormal gait, Decreased endurance, Pain, Postural dysfunction, Decreased range of motion  Visit Diagnosis: Chronic right-sided low back pain with  right-sided sciatica  Muscle spasm of back  Abnormal posture     Problem List There are no problems to display for this patient.   PT  DPT  12/26/2019, 3:42 PM  [facility] [address] [place], [place], [address] Phone: [phone]   Fax:  [phone]  Name: DERIN GRANQUIST MRN: [id number] Date of Birth: September 22, 1961

## 2019-12-30 ENCOUNTER — Encounter: Payer: Self-pay | Admitting: Physical Therapy

## 2019-12-30 ENCOUNTER — Other Ambulatory Visit: Payer: Self-pay

## 2019-12-30 ENCOUNTER — Ambulatory Visit: Payer: No Typology Code available for payment source | Admitting: Physical Therapy

## 2019-12-30 DIAGNOSIS — R293 Abnormal posture: Secondary | ICD-10-CM

## 2019-12-30 DIAGNOSIS — G8929 Other chronic pain: Secondary | ICD-10-CM

## 2019-12-30 DIAGNOSIS — M62830 Muscle spasm of back: Secondary | ICD-10-CM

## 2019-12-30 DIAGNOSIS — M5441 Lumbago with sciatica, right side: Secondary | ICD-10-CM | POA: Diagnosis not present

## 2019-12-30 NOTE — Therapy (Signed)
[facility] [address] [place], [place], [address] Phone: [phone]   Fax:  [phone]  Physical Therapy Treatment  Patient Details  Name: Marcus Sanders MRN: [id number] Date of Birth: Dec 08, 1960 Referring Provider (PT): Hoyt Koch MD    Encounter Date: 12/30/2019  PT End of Session - 12/30/19 0954    Visit Number  4    Number of Visits  15    Date for PT Re-Evaluation  02/09/20    Authorization Type  15 visits authorized by [facility]    Authorization - Visit Number  4    Authorization - Number of Visits  15    PT Start Time  0845    PT Stop Time  0928    PT Time Calculation (min)  43 min    Activity Tolerance  Patient tolerated treatment well    Behavior During Therapy  [facility] for tasks assessed/performed       Past Medical History:  Diagnosis Date  . Diabetes mellitus without complication (HCC)     History reviewed. No pertinent surgical history.  There were no vitals filed for this visit.  Subjective Assessment - 12/30/19 0915    Subjective  Patient hasd an injection. He feels like it is a little better when he is walking. He has been using his stretches which have helped.    Limitations  Standing;Walking    How long can you walk comfortably?  does not cause    Currently in Pain?  Yes    Pain Score  3     Pain Location  Back    Pain Orientation  Right;Left    Pain Descriptors / Indicators  Aching    Pain Type  Chronic pain    Pain Onset  More than a month ago    Pain Frequency  Intermittent    Aggravating Factors   standing and walking    Pain Relieving Factors  sitting    Effect of Pain on Daily Activities  limits standing    Multiple Pain Sites  No                       [facility] Adult PT Treatment/Exercise - 12/30/19 0001      Therapeutic Activites    Therapeutic Activities  Lifting    Lifting  extensice education provided on lifting tehcnique and resoning behind lifting tehcnique. Patient  shoots his knees forward. With cuing he can bring his hips back but he looses his balance. Patient shown how to do at home at the counter. He was also shown how to sodo in a chair. Patient strongly advised to practice this but dont over practice and dont apply yto his job until his balance is better.       Lumbar Exercises: Stretches   Passive Hamstring Stretch  Right;Left;2 reps;30 seconds    Piriformis Stretch  Right;Left;2 reps;30 seconds      Lumbar Exercises: Supine   Bridge  15 reps      Manual Therapy   Manual Therapy  Joint mobilization;Soft tissue mobilization    Joint Mobilization  LAD bilat. hips grade I-IV oscillations, lumbar PAs grade I-IV, lumbar side glides grade I-IV bilat. in sidelying; pelvic/spinal mobilization in sidelying     Soft tissue mobilization  STM lumbar paraspinals, IASTM/roller use use bilateral gluts, hamstrings, piriformis             PT Education - 12/30/19 0954    Education Details  reviewed lifting technique    Person(s) Educated  Patient    Methods  Explanation;Demonstration;Tactile cues;Verbal cues    Comprehension  Verbalized understanding;Returned demonstration;Verbal cues required;Tactile cues required       PT Short Term Goals - 12/26/19 1535      PT SHORT TERM GOAL #1   Title  Patient will increase90/90 hamstring length by 20 degrees    Time  3    Period  Weeks    Status  On-going    Target Date  01/05/20      PT SHORT TERM GOAL #2   Title  Patient will report no radiating pain down his right leg    Time  3    Period  Weeks    Status  On-going      PT SHORT TERM GOAL #3   Title  Patient will independent with basci stretching and strengthening program  program    Time  3    Status  On-going    Target Date  01/05/20        PT Long Term Goals - 12/15/19 0827      PT LONG TERM GOAL #1   Title  Patient will stand for 1 hour at work without self treported pain    Time  8    Period  Weeks    Target Date  02/09/20       PT LONG TERM GOAL #2   Title  Patient will sit in tight spaces at work without selft report of increased low back stiffness    Time  8    Period  Weeks    Status  New    Target Date  02/09/20            Plan - 12/30/19 0955    Clinical Impression Statement  Patient had less spasming in his back today but still has a moderate amount of spasming. He tolerated treatment wel. therapy continues to work on improving the mobility of his L-spine. Therapy worked on Engineer, manufacturing with hi, When he lifts with good technique he looses his balance backwards. He was advised to continue working on it at home but dont applyu it to work yet.    Comorbidities  peripheral neuropathy; right pinkie toe amputation    Examination-Activity Limitations  Stand    Examination-Participation Restrictions  Meal Prep    Stability/Clinical Decision Making  Evolving/Moderate complexity    Clinical Decision Making  Low    Rehab Potential  Excellent    PT Frequency  2x / week    PT Duration  8 weeks    PT Treatment/Interventions  ADLs/Self Care Home Management;Cryotherapy;Electrical Stimulation;Ultrasound;Moist Heat;Functional mobility training;Therapeutic activities;Therapeutic exercise;Neuromuscular re-education;Patient/family education;Manual techniques;Dry needling;Passive range of motion;Iontophoresis 4mg /ml Dexamethasone;Spinal Manipulations    PT Next Visit Plan  continue to work on spnal mobilty and increasing difficulty of core exercises    PT Home Exercise Plan  hamstring stretch; tennis ball trigger point release, piriformins stretch    Consulted and Agree with Plan of Care  Patient       Patient will benefit from skilled therapeutic intervention in order to improve the following deficits and impairments:  Abnormal gait, Decreased endurance, Pain, Postural dysfunction, Decreased range of motion  Visit Diagnosis: Chronic right-sided low back pain with right-sided sciatica  Muscle spasm of  back  Abnormal posture     Problem List There are no problems to display for this patient.   Carney Living PT DPT  12/30/2019, 10:08  AM  [facility] [address] [place], [place], [address] Phone: [phone]   Fax:  [phone]  Name: DVON JILES MRN: [id number] Date of Birth: 08/26/1961

## 2020-01-03 ENCOUNTER — Ambulatory Visit: Payer: No Typology Code available for payment source | Admitting: Physical Therapy

## 2020-01-03 ENCOUNTER — Encounter: Payer: Self-pay | Admitting: Physical Therapy

## 2020-01-03 ENCOUNTER — Other Ambulatory Visit: Payer: Self-pay

## 2020-01-03 DIAGNOSIS — G8929 Other chronic pain: Secondary | ICD-10-CM

## 2020-01-03 DIAGNOSIS — R293 Abnormal posture: Secondary | ICD-10-CM

## 2020-01-03 DIAGNOSIS — M5441 Lumbago with sciatica, right side: Secondary | ICD-10-CM | POA: Diagnosis not present

## 2020-01-03 DIAGNOSIS — M62830 Muscle spasm of back: Secondary | ICD-10-CM

## 2020-01-03 NOTE — Therapy (Signed)
[facility] [address] [place], [place], [address] Phone: [phone]   Fax:  [phone]  Physical Therapy Treatment  Patient Details  Name: Marcus Sanders MRN: [id number] Date of Birth: 23-Sep-1961 Referring Provider (PT): Hoyt Koch MD    Encounter Date: 01/03/2020  PT End of Session - 01/03/20 1411    Visit Number  5    Number of Visits  15    Date for PT Re-Evaluation  02/09/20    Authorization Type  15 visits authorized by [facility]    Authorization - Visit Number  4    Authorization - Number of Visits  15    PT Start Time  1015    PT Stop Time  1056    PT Time Calculation (min)  41 min    Activity Tolerance  Patient tolerated treatment well    Behavior During Therapy  [facility] for tasks assessed/performed       Past Medical History:  Diagnosis Date  . Diabetes mellitus without complication (HCC)     History reviewed. No pertinent surgical history.  There were no vitals filed for this visit.  Subjective Assessment - 01/03/20 1409    Subjective  Patient reports his back is about the same. He continues to have significant pain when he stands. 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    Limitations  Standing;Walking    How long can you walk comfortably?  does not cause    Currently in Pain?  No/denies                       [facility] Adult PT Treatment/Exercise - 01/03/20 0001      Lumbar Exercises: Stretches   Passive Hamstring Stretch  Right;Left;2 reps;30 seconds    Piriformis Stretch  Right;Left;2 reps;30 seconds      Lumbar Exercises: Standing   Other Standing Lumbar Exercises  standing scap retraction 2x10 red; shoulder extension 2x10 red  with abdominal breathing       Lumbar Exercises: Supine   AB Set Limitations  reviewed abdominal breathing     Bent Knee Raise  15 reps      Manual Therapy   Manual Therapy  Joint mobilization;Soft tissue mobilization    Joint Mobilization  LAD  bilat. hips grade I-IV oscillations, lumbar PAs grade I-IV, lumbar side glides grade I-IV bilat. in sidelying; pelvic/spinal mobilization in sidelying     Soft tissue mobilization  STM lumbar paraspinals, IASTM/roller use use bilateral gluts, hamstrings, piriformis             PT Education - 01/03/20 1410    Education Details  updated HEP    Person(s) Educated  Patient    Methods  Explanation;Demonstration;Tactile cues;Verbal cues    Comprehension  Returned demonstration;Verbalized understanding;Verbal cues required;Tactile cues required       PT Short Term Goals - 12/26/19 1535      PT SHORT TERM GOAL #1   Title  Patient will increase90/90 hamstring length by 20 degrees    Time  3    Period  Weeks    Status  On-going    Target Date  01/05/20      PT SHORT TERM GOAL #2   Title  Patient will report no radiating pain down his right leg    Time  3    Period  Weeks    Status  On-going      PT SHORT TERM  GOAL #3   Title  Patient will independent with basci stretching and strengthening program  program    Time  3    Status  On-going    Target Date  01/05/20        PT Long Term Goals - 12/15/19 0827      PT LONG TERM GOAL #1   Title  Patient will stand for 1 hour at work without self treported pain    Time  8    Period  Weeks    Target Date  02/09/20      PT LONG TERM GOAL #2   Title  Patient will sit in tight spaces at work without selft report of increased low back stiffness    Time  8    Period  Weeks    Status  New    Target Date  02/09/20            Plan - 01/03/20 1416    Clinical Impression Statement  The patient had increased pain standing with ther-ex. He was advised at home to work until he has pain then rest. He had a mild improvement in spasming this visit. Therapy will continue with manual therapy and strengthening exercsies. Patient given updated HEP    Comorbidities  peripheral neuropathy; right pinkie toe amputation    Examination-Activity  Limitations  Stand    Examination-Participation Restrictions  Meal Prep    Stability/Clinical Decision Making  Evolving/Moderate complexity    Clinical Decision Making  Low    Rehab Potential  Excellent    PT Frequency  2x / week    PT Duration  8 weeks    PT Treatment/Interventions  ADLs/Self Care Home Management;Cryotherapy;Electrical Stimulation;Ultrasound;Moist Heat;Functional mobility training;Therapeutic activities;Therapeutic exercise;Neuromuscular re-education;Patient/family education;Manual techniques;Dry needling;Passive range of motion;Iontophoresis 4mg /ml Dexamethasone;Spinal Manipulations    PT Next Visit Plan  continue to work on spnal mobilty and increasing difficulty of core exercises    PT Home Exercise Plan  hamstring stretch; tennis ball trigger point release, piriformins stretch    Consulted and Agree with Plan of Care  Patient       Patient will benefit from skilled therapeutic intervention in order to improve the following deficits and impairments:  Abnormal gait, Decreased endurance, Pain, Postural dysfunction, Decreased range of motion  Visit Diagnosis: Chronic right-sided low back pain with right-sided sciatica  Muscle spasm of back  Abnormal posture     Problem List There are no problems to display for this patient.   Carney Living PT DPT  01/03/2020, 2:20 PM  [facility] [address] [place], [place], [address] Phone: [phone]   Fax:  [phone]  Name: Marcus Sanders MRN: [id number] Date of Birth: 12-05-1960

## 2020-01-06 ENCOUNTER — Other Ambulatory Visit: Payer: Self-pay

## 2020-01-06 ENCOUNTER — Ambulatory Visit: Payer: No Typology Code available for payment source | Admitting: Physical Therapy

## 2020-01-06 DIAGNOSIS — M5441 Lumbago with sciatica, right side: Secondary | ICD-10-CM | POA: Diagnosis not present

## 2020-01-06 DIAGNOSIS — G8929 Other chronic pain: Secondary | ICD-10-CM

## 2020-01-06 DIAGNOSIS — R293 Abnormal posture: Secondary | ICD-10-CM

## 2020-01-06 DIAGNOSIS — M62830 Muscle spasm of back: Secondary | ICD-10-CM

## 2020-01-08 ENCOUNTER — Encounter: Payer: Self-pay | Admitting: Physical Therapy

## 2020-01-08 NOTE — Therapy (Signed)
[place], [place], [address] Phone: [phone]   Fax:  [phone]  Physical Therapy Treatment  Patient Details  Name: Marcus Sanders MRN: [id number] Date of Birth: 09/15/1961 Referring Provider (PT): Duffy Rhody MD    Encounter Date: 01/06/2020  PT End of Session - 01/08/20 0931    Visit Number  6    Number of Visits  15    Date for PT Re-Evaluation  02/09/20    Authorization Type  15 visits authorized by [facility] - Visit Number  4    Authorization - Number of Visits  15    PT Start Time  7124    PT Stop Time  1058    PT Time Calculation (min)  43 min    Activity Tolerance  Patient tolerated treatment well    Behavior During Therapy  [facility] for tasks assessed/performed       Past Medical History:  Diagnosis Date  . Diabetes mellitus without complication ([place])     History reviewed. No pertinent surgical history.  There were no vitals filed for this visit.  Subjective Assessment - 01/08/20 0928    Subjective  Patient continues to report similar problems with standing. He reports most of the time his back feels good. He would like to try trigger point dry needling. He feels it is the only thing we havent tried.    Limitations  Standing;Walking    Currently in Pain?  No/denies   only when he stands for more then 15-20 minutes                      [facility] Adult PT Treatment/Exercise - 01/08/20 0001      Lumbar Exercises: Stretches   Passive Hamstring Stretch  Right;Left;2 reps;30 seconds    Piriformis Stretch  Right;Left;2 reps;30 seconds      Lumbar Exercises: Standing   Other Standing Lumbar Exercises  standing scap retraction 2x10 red; shoulder extension 2x10 red  with abdominal breathing       Lumbar Exercises: Supine   Bent Knee Raise  15 reps    Bridge  10 reps    Other Supine Lumbar Exercises  supine clam shell 2x10 green       Manual Therapy   Manual  Therapy  Joint mobilization;Soft tissue mobilization    Joint Mobilization  LAD bilat. hips grade I-IV oscillations, lumbar PAs grade I-IV, lumbar side glides grade I-IV bilat. in sidelying; pelvic/spinal mobilization in sidelying     Soft tissue mobilization  STM lumbar paraspinals, IASTM/roller use use bilateral gluts, hamstrings, piriformis; deep trigger point release to gluteals        Trigger Point Dry Needling - 01/08/20 0001    Consent Given?  Yes    Education Handout Provided  Yes    Muscles Treated Back/Hip  Gluteus medius;Gluteus maximus    Gluteus Medius Response  Twitch response elicited    Gluteus Maximus Response  Twitch response elicited           PT Education - 01/08/20 0930    Education Details  nefits and risks of TPDN see below    Person(s) Educated  Patient    Methods  Explanation;Demonstration;Tactile cues;Verbal cues    Comprehension  Verbalized understanding;Returned demonstration;Verbal cues required;Tactile cues required       PT Short Term Goals - 01/08/20 0933      PT SHORT TERM GOAL #1   Title  Patient will increase90/90 hamstring length by 20 degrees    Time  3    Period  Weeks    Status  On-going    Target Date  01/05/20      PT SHORT TERM GOAL #2   Title  Patient will report no radiating pain down his right leg    Time  3    Period  Weeks    Status  On-going      PT SHORT TERM GOAL #3   Title  Patient will independent with basci stretching and strengthening program  program    Time  3    Period  Weeks    Status  On-going        PT Long Term Goals - 12/15/19 0827      PT LONG TERM GOAL #1   Title  Patient will stand for 1 hour at work without self treported pain    Time  8    Period  Weeks    Target Date  02/09/20      PT LONG TERM GOAL #2   Title  Patient will sit in tight spaces at work without selft report of increased low back stiffness    Time  8    Period  Weeks    Status  New    Target Date  02/09/20             Plan - 01/08/20 0931    Clinical Impression Statement  Patient was informed of the risks of needling with diabetes. The patient has aremote history of great toe amputation. The patient reports he gets shots all the time and wishes to proceed with dry needling. He is well aware of the risks and would like to proceed. He was needled in 3 spots in his gluteals and had a great twtich respose wth each. Therapy reviewed light stretching and exercises to recue post needle soreness.    Personal Factors and Comorbidities  Comorbidity 1;Comorbidity 2    Comorbidities  peripheral neuropathy; right pinkie toe amputation    Examination-Activity Limitations  Stand    Stability/Clinical Decision Making  Evolving/Moderate complexity    Clinical Decision Making  Low    Rehab Potential  Good    PT Frequency  2x / week    PT Duration  8 weeks    PT Treatment/Interventions  ADLs/Self Care Home Management;Cryotherapy;Electrical Stimulation;Ultrasound;Moist Heat;Functional mobility training;Therapeutic activities;Therapeutic exercise;Neuromuscular re-education;Patient/family education;Manual techniques;Dry needling;Passive range of motion;Iontophoresis 4mg /ml Dexamethasone;Spinal Manipulations    PT Next Visit Plan  continue to work on spnal mobilty and increasing difficulty of core exercises; assess tolerance to needling    PT Home Exercise Plan  hamstring stretch; tennis ball trigger point release, piriformins stretch    Consulted and Agree with Plan of Care  Patient       Patient will benefit from skilled therapeutic intervention in order to improve the following deficits and impairments:  Abnormal gait, Decreased endurance, Pain, Postural dysfunction, Decreased range of motion  Visit Diagnosis: Chronic right-sided low back pain with right-sided sciatica  Muscle spasm of back  Abnormal posture     Problem List There are no problems to display for this patient.    PT DPT   01/08/2020, 9:38 AM  [facility] [address] [place], [place], [place] Phone: [phone]   Fax:  [phone]  Name: MARLEN MOLLICA MRN: [name] Date of Birth: [birth year]

## 2020-01-10 ENCOUNTER — Other Ambulatory Visit: Payer: Self-pay

## 2020-01-10 ENCOUNTER — Encounter: Payer: Self-pay | Admitting: Physical Therapy

## 2020-01-10 ENCOUNTER — Ambulatory Visit: Payer: No Typology Code available for payment source | Admitting: Physical Therapy

## 2020-01-10 DIAGNOSIS — G8929 Other chronic pain: Secondary | ICD-10-CM

## 2020-01-10 DIAGNOSIS — M5441 Lumbago with sciatica, right side: Secondary | ICD-10-CM | POA: Diagnosis not present

## 2020-01-10 DIAGNOSIS — M62830 Muscle spasm of back: Secondary | ICD-10-CM

## 2020-01-10 DIAGNOSIS — R293 Abnormal posture: Secondary | ICD-10-CM

## 2020-01-10 NOTE — Therapy (Signed)
[place], [place], [address] Phone: [phone]   Fax:  [phone]  Physical Therapy Treatment  Patient Details  Name: Marcus Sanders MRN: [id number] Date of Birth: 12/19/1960 Referring Provider (PT): Marcus Rhody MD    Encounter Date: 01/10/2020  PT End of Session - 01/10/20 1042    Visit Number  7    Number of Visits  15    Date for PT Re-Evaluation  02/09/20    Authorization Type  15 visits authorized by [facility]    PT Start Time  4650    PT Stop Time  1058    PT Time Calculation (min)  43 min    Activity Tolerance  Patient tolerated treatment well    Behavior During Therapy  [facility] for tasks assessed/performed       Past Medical History:  Diagnosis Date  . Diabetes mellitus without complication ([place])     History reviewed. No pertinent surgical history.  There were no vitals filed for this visit.  Subjective Assessment - 01/10/20 1040    Subjective  Patient continues to have similar symtpoms. He is not in much pain today but as the day goes on he is still having significant pain.    Limitations  Standing;Walking    How long can you walk comfortably?  does not cause    Currently in Pain?  No/denies                       [facility] Adult PT Treatment/Exercise - 01/10/20 0001      Lumbar Exercises: Stretches   Passive Hamstring Stretch  Right;Left;2 reps;30 seconds    Piriformis Stretch  Right;Left;2 reps;30 seconds      Lumbar Exercises: Standing   Other Standing Lumbar Exercises  standing scap retraction 2x10 green; shoulder extension 2x10 green  with abdominal breathing     Other Standing Lumbar Exercises  mini squat with mod cuing for techniuqe. Continues to loose balance backwards. Did bett with hand hold on tredmill.       Lumbar Exercises: Supine   Bent Knee Raise  15 reps    Bridge  15 reps    Other Supine Lumbar Exercises  supine clam shell 2x10 green       Manual Therapy    Manual Therapy  Joint mobilization;Soft tissue mobilization    Joint Mobilization  LAD bilat. hips grade I-IV oscillations, lumbar PAs grade I-IV, lumbar side glides grade I-IV bilat. in sidelying; pelvic/spinal mobilization in sidelying     Soft tissue mobilization  STM lumbar paraspinals, IASTM/roller use use bilateral gluts, hamstrings, piriformis; deep trigger point release to gluteals              PT Education - 01/10/20 1041    Education Details  reviewed technique with ther-ex    Person(s) Educated  Patient    Methods  Explanation;Tactile cues;Verbal cues;Demonstration    Comprehension  Verbalized understanding;Returned demonstration;Verbal cues required;Tactile cues required       PT Short Term Goals - 01/08/20 0933      PT SHORT TERM GOAL #1   Title  Patient will increase90/90 hamstring length by 20 degrees    Time  3    Period  Weeks    Status  On-going    Target Date  01/05/20      PT SHORT TERM GOAL #2   Title  Patient will report no radiating pain down his right leg  Time  3    Period  Weeks    Status  On-going      PT SHORT TERM GOAL #3   Title  Patient will independent with basci stretching and strengthening program  program    Time  3    Period  Weeks    Status  On-going        PT Long Term Goals - 12/15/19 0827      PT LONG TERM GOAL #1   Title  Patient will stand for 1 hour at work without self treported pain    Time  8    Period  Weeks    Target Date  02/09/20      PT LONG TERM GOAL #2   Title  Patient will sit in tight spaces at work without selft report of increased low back stiffness    Time  8    Period  Weeks    Status  New    Target Date  02/09/20            Plan - 01/10/20 1047    Clinical Impression Statement  Patient required moderate cuing for brething and to not strain with ther-ex today. He continues to have spasming in his lumbar spine and gluteals. He was sore after the needling last visit. Therapy will continue  to progress ther-ex as tolerated.    Personal Factors and Comorbidities  Comorbidity 1;Comorbidity 2    Comorbidities  peripheral neuropathy; right pinkie toe amputation    Examination-Activity Limitations  Stand    Stability/Clinical Decision Making  Evolving/Moderate complexity    Clinical Decision Making  Low    Rehab Potential  Good    PT Frequency  2x / week    PT Duration  8 weeks    PT Treatment/Interventions  ADLs/Self Care Home Management;Cryotherapy;Electrical Stimulation;Ultrasound;Moist Heat;Functional mobility training;Therapeutic activities;Therapeutic exercise;Neuromuscular re-education;Patient/family education;Manual techniques;Dry needling;Passive range of motion;Iontophoresis 4mg /ml Dexamethasone;Spinal Manipulations    PT Next Visit Plan  continue to work on spnal mobilty and increasing difficulty of core exercises; consider needling next visit.    PT Home Exercise Plan  hamstring stretch; tennis ball trigger point release, piriformins stretch    Consulted and Agree with Plan of Care  Patient       Patient will benefit from skilled therapeutic intervention in order to improve the following deficits and impairments:  Abnormal gait, Decreased endurance, Pain, Postural dysfunction, Decreased range of motion  Visit Diagnosis: Chronic right-sided low back pain with right-sided sciatica  Muscle spasm of back  Abnormal posture     Problem List There are no problems to display for this patient.   PT DPT  01/10/2020, 11:24 AM  [facility] [address] [place], [place], [place] Phone: [phone]   Fax:  [phone]  Name: Marcus Sanders MRN: [name] Date of Birth: 1961/08/13

## 2020-01-13 ENCOUNTER — Other Ambulatory Visit: Payer: Self-pay

## 2020-01-13 ENCOUNTER — Ambulatory Visit: Payer: No Typology Code available for payment source | Admitting: Physical Therapy

## 2020-01-13 ENCOUNTER — Encounter: Payer: Self-pay | Admitting: Physical Therapy

## 2020-01-13 DIAGNOSIS — M5441 Lumbago with sciatica, right side: Secondary | ICD-10-CM | POA: Diagnosis not present

## 2020-01-13 DIAGNOSIS — M62830 Muscle spasm of back: Secondary | ICD-10-CM

## 2020-01-13 DIAGNOSIS — G8929 Other chronic pain: Secondary | ICD-10-CM

## 2020-01-13 DIAGNOSIS — R293 Abnormal posture: Secondary | ICD-10-CM

## 2020-01-13 NOTE — Therapy (Signed)
[facility] [address] [place], [place], [address] Phone: [phone]   Fax:  [phone]  Physical Therapy Evaluation  Patient Details  Name: Marcus Sanders MRN: [id number] Date of Birth: [birth year] Referring Provider (PT): Hoyt Koch MD    Encounter Date: 01/13/2020  PT End of Session - 01/13/20 1025    Visit Number  8    Number of Visits  15    Date for PT Re-Evaluation  02/09/20    Authorization Type  15 visits authorized by [facility]    Authorization - Visit Number  8    Authorization - Number of Visits  15    PT Start Time  1018    PT Stop Time  1100    PT Time Calculation (min)  42 min    Activity Tolerance  Patient tolerated treatment well    Behavior During Therapy  [facility] for tasks assessed/performed       Past Medical History:  Diagnosis Date  . Diabetes mellitus without complication (HCC)     History reviewed. No pertinent surgical history.  There were no vitals filed for this visit.   Subjective Assessment - 01/13/20 1022    Subjective  Patieint feels like it might be getting a little better. He has been able to push it a little more. He continues to have increased pain climbing a ladder.    Limitations  Standing;Walking    Currently in Pain?  Yes    Pain Score  2     Pain Location  Back    Pain Orientation  Right    Pain Descriptors / Indicators  Aching    Pain Type  Chronic pain    Pain Onset  More than a month ago    Pain Frequency  Intermittent    Aggravating Factors   standing and walking    Pain Relieving Factors  sitting    Effect of Pain on Daily Activities  limits standing                    Objective measurements completed on examination: See above findings.      [facility] Adult PT Treatment/Exercise - 01/13/20 0001      Lumbar Exercises: Stretches   Passive Hamstring Stretch  Right;Left;2 reps;30 seconds    Piriformis Stretch  Right;Left;2 reps;30 seconds      Lumbar  Exercises: Standing   Other Standing Lumbar Exercises  standing scap retraction 2x10 blue; shoulder extension 2x10 blue   with abdominal breathing     Other Standing Lumbar Exercises  mini squat to chair with significant improvement in technique       Lumbar Exercises: Supine   Bent Knee Raise  15 reps    Bridge  15 reps    Other Supine Lumbar Exercises  supine clam shell 2x10 green       Manual Therapy   Manual Therapy  Joint mobilization;Soft tissue mobilization    Joint Mobilization  LAD bilat. s grade I-IV oscillations, lumbar PAs grade I-IV, lumbar side glides grade I-IV bilat. in sidelying;     Soft tissue mobilization  STM lumbar paraspinals, IASTM/roller use use bilateral gluts, hamstrings, piriformis; deep trigger point release to gluteals        Trigger Point Dry Needling - 01/13/20 0001    Consent Given?  Yes    Education Handout Provided  Yes    Muscles Treated Back/Hip  Lumbar multifidi    Gluteus Medius Response  Twitch  response elicited    Gluteus Maximus Response  Twitch response elicited    Lumbar multifidi Response  Twitch response elicited           PT Education - 01/13/20 1024    Education Details  HEP and symptom management    Person(s) Educated  Patient    Methods  Explanation;Demonstration;Tactile cues;Verbal cues    Comprehension  Verbalized understanding;Returned demonstration;Verbal cues required;Tactile cues required       PT Short Term Goals - 01/13/20 1132      PT SHORT TERM GOAL #1   Title  Patient will increase90/90 hamstring length by 20 degrees    Time  3    Status  On-going    Target Date  01/05/20      PT SHORT TERM GOAL #2   Title  Patient will report no radiating pain down his right leg    Time  3    Period  Weeks    Status  On-going    Target Date  01/05/20      PT SHORT TERM GOAL #3   Title  Patient will independent with basci stretching and strengthening program  program    Time  3    Period  Weeks    Status  On-going     Target Date  01/05/20        PT Long Term Goals - 12/15/19 0827      PT LONG TERM GOAL #1   Title  Patient will stand for 1 hour at work without self treported pain    Time  8    Period  Weeks    Target Date  02/09/20      PT LONG TERM GOAL #2   Title  Patient will sit in tight spaces at work without selft report of increased low back stiffness    Time  8    Period  Weeks    Status  New    Target Date  02/09/20             Plan - 01/13/20 1049    Clinical Impression Statement  Therapy needled the patients lower lumbar spine bilaterally and his glut medius. He had a good twtich with each spot. His lumbar PA mobility is improving. His squat to a chair has improved significanty. He is tolerating exercises well.    Personal Factors and Comorbidities  Comorbidity 1;Comorbidity 2    Comorbidities  peripheral neuropathy; right pinkie toe amputation    Examination-Activity Limitations  Stand    Examination-Participation Restrictions  Meal Prep    Stability/Clinical Decision Making  Evolving/Moderate complexity    Clinical Decision Making  Low    Rehab Potential  Good    PT Frequency  2x / week    PT Duration  8 weeks    PT Treatment/Interventions  ADLs/Self Care Home Management;Cryotherapy;Electrical Stimulation;Ultrasound;Moist Heat;Functional mobility training;Therapeutic activities;Therapeutic exercise;Neuromuscular re-education;Patient/family education;Manual techniques;Dry needling;Passive range of motion;Iontophoresis 4mg /ml Dexamethasone;Spinal Manipulations    PT Next Visit Plan  continue to work on spnal mobilty and increasing difficulty of core exercises; consider needling next visit.    PT Home Exercise Plan  hamstring stretch; tennis ball trigger point release, piriformins stretch    Consulted and Agree with Plan of Care  Patient       Patient will benefit from skilled therapeutic intervention in order to improve the following deficits and impairments:  Abnormal  gait, Decreased endurance, Pain, Postural dysfunction, Decreased range of motion  Visit Diagnosis: Chronic  right-sided low back pain with right-sided sciatica  Muscle spasm of back  Abnormal posture     Problem List There are no problems to display for this patient.   Carney Living PT DPT  01/13/2020, 11:33 AM  [facility] [address] [place], [place], [address] Phone: [phone]   Fax:  [phone]  Name: Marcus Sanders MRN: [id number] Date of Birth: 1961/05/20

## 2020-01-17 ENCOUNTER — Encounter: Payer: Self-pay | Admitting: Physical Therapy

## 2020-01-17 ENCOUNTER — Ambulatory Visit: Payer: No Typology Code available for payment source | Admitting: Physical Therapy

## 2020-01-17 ENCOUNTER — Other Ambulatory Visit: Payer: Self-pay

## 2020-01-17 DIAGNOSIS — M5441 Lumbago with sciatica, right side: Secondary | ICD-10-CM

## 2020-01-17 DIAGNOSIS — G8929 Other chronic pain: Secondary | ICD-10-CM

## 2020-01-17 DIAGNOSIS — M62830 Muscle spasm of back: Secondary | ICD-10-CM

## 2020-01-17 DIAGNOSIS — R293 Abnormal posture: Secondary | ICD-10-CM

## 2020-01-18 NOTE — Therapy (Signed)
[facility] [address] [place], [place], [address] Phone: [phone]   Fax:  [phone]  Physical Therapy Treatment  Patient Details  Name: Marcus Sanders MRN: [id number] Date of Birth: 1961/04/21 Referring Provider (PT): Hoyt Koch MD    Encounter Date: 01/17/2020  PT End of Session - 01/18/20 1322    Visit Number  9    Number of Visits  15    Date for PT Re-Evaluation  02/09/20    Authorization Type  15 visits authorized by [facility]    Authorization - Visit Number  8    Authorization - Number of Visits  15    PT Start Time  1500    PT Stop Time  1543    PT Time Calculation (min)  43 min    Activity Tolerance  Patient tolerated treatment well    Behavior During Therapy  [facility] for tasks assessed/performed       Past Medical History:  Diagnosis Date  . Diabetes mellitus without complication (HCC)     History reviewed. No pertinent surgical history.  There were no vitals filed for this visit.  Subjective Assessment - 01/17/20 1501    Subjective  Patient reports the pain in the middle of his back and glutes  has improved. he is still having pain int his legs when he stands.    Limitations  Standing;Walking    How long can you walk comfortably?  does not cause    Currently in Pain?  No/denies                       [facility] Adult PT Treatment/Exercise - 01/18/20 0001      Lumbar Exercises: Stretches   Passive Hamstring Stretch  Right;Left;2 reps;30 seconds    Piriformis Stretch  Right;Left;2 reps;30 seconds      Lumbar Exercises: Standing   Other Standing Lumbar Exercises  standing scap retraction 2x10 blue; shoulder extension 2x10 blue   with abdominal breathing ; standing pallof press green band 2x10; chop 2x10 each ay green     Other Standing Lumbar Exercises  mini squat to chair with significant improvement in technique       Lumbar Exercises: Supine   Bent Knee Raise  15 reps    Bridge  15 reps    Straight Leg Raises Limitations  2x10 each leg with cuing for stabilization     Other Supine Lumbar Exercises  supine clam shell 2x10 green       Manual Therapy   Manual Therapy  Joint mobilization;Soft tissue mobilization    Joint Mobilization  LAD bilat. s grade I-IV oscillations, lumbar PAs grade I-IV, lumbar side glides grade I-IV bilat. in sidelying;     Soft tissue mobilization  STM lumbar paraspinals, IASTM/roller use use bilateral gluts, hamstrings, piriformis; deep trigger point release to gluteals        Trigger Point Dry Needling - 01/18/20 0001    Consent Given?  Yes    Education Handout Provided  No    Gluteus Medius Response  Twitch response elicited    Lumbar multifidi Response  Twitch response elicited           PT Education - 01/18/20 1321    Education Details  reviewed the benefits of progressive strengthening    Methods  Explanation;Demonstration;Tactile cues;Verbal cues    Comprehension  Verbalized understanding;Returned demonstration;Verbal cues required;Tactile cues required       PT Short Term Goals -  01/18/20 1325      PT SHORT TERM GOAL #1   Title  Patient will increase90/90 hamstring length by 20 degrees    Time  3    Period  Weeks    Status  On-going    Target Date  01/05/20      PT SHORT TERM GOAL #2   Title  Patient will report no radiating pain down his right leg    Time  3    Period  Weeks    Status  On-going    Target Date  01/05/20      PT SHORT TERM GOAL #3   Title  Patient will independent with basci stretching and strengthening program  program    Time  3    Period  Weeks    Status  On-going    Target Date  01/05/20        PT Long Term Goals - 12/15/19 0827      PT LONG TERM GOAL #1   Title  Patient will stand for 1 hour at work without self treported pain    Time  8    Period  Weeks    Target Date  02/09/20      PT LONG TERM GOAL #2   Title  Patient will sit in tight spaces at work without selft report of increased  low back stiffness    Time  8    Period  Weeks    Status  New    Target Date  02/09/20            Plan - 01/18/20 1322    Clinical Impression Statement  Patient tolerated treatment well. His spasm in his gluteal is improving. His tolerance to exercises are improving. Therapy will continue to progress the patient as tolerance. Therapy added pallof press and chops. Patient had no increase in pain.    Personal Factors and Comorbidities  Comorbidity 1;Comorbidity 2    Comorbidities  peripheral neuropathy; right pinkie toe amputation    Examination-Activity Limitations  Stand    Examination-Participation Restrictions  Meal Prep    Stability/Clinical Decision Making  Evolving/Moderate complexity    Clinical Decision Making  Low    Rehab Potential  Good    PT Frequency  2x / week    PT Duration  8 weeks    PT Treatment/Interventions  ADLs/Self Care Home Management;Cryotherapy;Electrical Stimulation;Ultrasound;Moist Heat;Functional mobility training;Therapeutic activities;Therapeutic exercise;Neuromuscular re-education;Patient/family education;Manual techniques;Dry needling;Passive range of motion;Iontophoresis 4mg /ml Dexamethasone;Spinal Manipulations    PT Next Visit Plan  continue to work on spnal mobilty and increasing difficulty of core exercises; consider needling next visit.    PT Home Exercise Plan  hamstring stretch; tennis ball trigger point release, piriformins stretch    Consulted and Agree with Plan of Care  Patient       Patient will benefit from skilled therapeutic intervention in order to improve the following deficits and impairments:  Abnormal gait, Decreased endurance, Pain, Postural dysfunction, Decreased range of motion  Visit Diagnosis: Chronic right-sided low back pain with right-sided sciatica  Muscle spasm of back  Abnormal posture     Problem List There are no problems to display for this patient.   Carney Living  PT DPT 01/18/2020, 1:31 PM  [facility] [address] [place], [place], [address] Phone: [phone]   Fax:  [phone]  Name: Marcus Sanders MRN: [id number] Date of Birth: December 26, 1960

## 2020-01-20 ENCOUNTER — Ambulatory Visit: Payer: No Typology Code available for payment source | Admitting: Physical Therapy

## 2020-01-24 ENCOUNTER — Other Ambulatory Visit: Payer: Self-pay

## 2020-01-24 ENCOUNTER — Ambulatory Visit: Payer: No Typology Code available for payment source | Admitting: Physical Therapy

## 2020-01-24 ENCOUNTER — Encounter: Payer: Self-pay | Admitting: Physical Therapy

## 2020-01-24 DIAGNOSIS — G8929 Other chronic pain: Secondary | ICD-10-CM

## 2020-01-24 DIAGNOSIS — R293 Abnormal posture: Secondary | ICD-10-CM

## 2020-01-24 DIAGNOSIS — M5441 Lumbago with sciatica, right side: Secondary | ICD-10-CM | POA: Diagnosis not present

## 2020-01-24 DIAGNOSIS — M62830 Muscle spasm of back: Secondary | ICD-10-CM

## 2020-01-24 NOTE — Therapy (Signed)
[facility] [address] [place], [place], [address] Phone: [phone]   Fax:  [phone]  Physical Therapy Treatment  Patient Details  Name: Marcus Sanders MRN: [id number] Date of Birth: 05/03/1961 Referring Provider (PT): Hoyt Koch MD    Encounter Date: 01/24/2020  PT End of Session - 01/24/20 1049    Visit Number  10    Number of Visits  15    Date for PT Re-Evaluation  02/09/20    Authorization Type  15 visits authorized by [facility]    Authorization - Visit Number  8    Authorization - Number of Visits  15    PT Start Time  1015    PT Stop Time  1056    PT Time Calculation (min)  41 min    Activity Tolerance  Patient tolerated treatment well    Behavior During Therapy  [facility] for tasks assessed/performed       Past Medical History:  Diagnosis Date  . Diabetes mellitus without complication (HCC)     History reviewed. No pertinent surgical history.  There were no vitals filed for this visit.  Subjective Assessment - 01/24/20 1020    Subjective  Patient had his injections on Friday. His back is feeling better. He is standing longer at work. he is still having minor pain.    Limitations  Standing;Walking    How long can you walk comfortably?  does not cause    Currently in Pain?  No/denies   pain only when he is standing but less                      [facility] Adult PT Treatment/Exercise - 01/24/20 0001      Lumbar Exercises: Stretches   Passive Hamstring Stretch  Right;Left;2 reps;30 seconds    Piriformis Stretch  Right;Left;2 reps;30 seconds      Lumbar Exercises: Standing   Other Standing Lumbar Exercises  standing scap retraction 2x10 blue; shoulder extension 2x10 blue   with abdominal breathing ; standing pallof press green band 2x10;     Other Standing Lumbar Exercises  --      Lumbar Exercises: Supine   Bent Knee Raise  15 reps    Bridge  15 reps    Straight Leg Raises Limitations  2x10 each  leg with cuing for stabilization     Other Supine Lumbar Exercises  supine clam shell 2x10 blue       Manual Therapy   Manual Therapy  Joint mobilization;Soft tissue mobilization    Joint Mobilization  LAD bilat. s grade I-IV oscillations, lumbar PAs grade I-IV, lumbar side glides grade I-IV bilat. in sidelying;     Soft tissue mobilization  STM lumbar paraspinals, IASTM/roller use use bilateral gluts, hamstrings, piriformis; deep trigger point release to gluteals        Trigger Point Dry Needling - 01/24/20 0001    Consent Given?  Yes    Education Handout Provided  No    Other Dry Needling  3 sapots needled in the right glut medius using a 30x75 needle    Gluteus Medius Response  Twitch response elicited           PT Education - 01/24/20 1048    Education Details  inmprotance of strengthening    Person(s) Educated  Patient    Methods  Explanation;Demonstration;Verbal cues;Tactile cues    Comprehension  Returned demonstration;Tactile cues required;Verbal cues required;Verbalized understanding  PT Short Term Goals - 01/18/20 1325      PT SHORT TERM GOAL #1   Title  Patient will increase90/90 hamstring length by 20 degrees    Time  3    Period  Weeks    Status  On-going    Target Date  01/05/20      PT SHORT TERM GOAL #2   Title  Patient will report no radiating pain down his right leg    Time  3    Period  Weeks    Status  On-going    Target Date  01/05/20      PT SHORT TERM GOAL #3   Title  Patient will independent with basci stretching and strengthening program  program    Time  3    Period  Weeks    Status  On-going    Target Date  01/05/20        PT Long Term Goals - 12/15/19 0827      PT LONG TERM GOAL #1   Title  Patient will stand for 1 hour at work without self treported pain    Time  8    Period  Weeks    Target Date  02/09/20      PT LONG TERM GOAL #2   Title  Patient will sit in tight spaces at work without selft report of increased low  back stiffness    Time  8    Period  Weeks    Status  New    Target Date  02/09/20            Plan - 01/24/20 1111    Clinical Impression Statement  Patient is making progress. He he was able to climb on a ladder over the weekend woithout pain. He is standing longer with less severe pain. He is tolerating ther-ex well. He was advanced to blue banmd ewxercises. Patient advised to continue to work on his strengthening especially now that the shot has decreased his pain.    Personal Factors and Comorbidities  Comorbidity 1;Comorbidity 2    Comorbidities  peripheral neuropathy; right pinkie toe amputation    Stability/Clinical Decision Making  Evolving/Moderate complexity    Clinical Decision Making  Low    Rehab Potential  Good    PT Frequency  2x / week    PT Duration  8 weeks    PT Treatment/Interventions  ADLs/Self Care Home Management;Cryotherapy;Electrical Stimulation;Ultrasound;Moist Heat;Functional mobility training;Therapeutic activities;Therapeutic exercise;Neuromuscular re-education;Patient/family education;Manual techniques;Dry needling;Passive range of motion;Iontophoresis 4mg /ml Dexamethasone;Spinal Manipulations    PT Next Visit Plan  continue to work on spnal mobilty and increasing difficulty of core exercises; consider needling next visit.    PT Home Exercise Plan  hamstring stretch; tennis ball trigger point release, piriformins stretch       Patient will benefit from skilled therapeutic intervention in order to improve the following deficits and impairments:  Abnormal gait, Decreased endurance, Pain, Postural dysfunction, Decreased range of motion  Visit Diagnosis: Chronic right-sided low back pain with right-sided sciatica  Muscle spasm of back  Abnormal posture     Problem List There are no problems to display for this patient.   Carney Living  PT DPT  01/24/2020, 11:18 AM  [facility] [address] [place], [place], [address] Phone: [phone]   Fax:  [phone]  Name: Marcus Sanders MRN: [id number] Date of Birth: Nov 15, 1960

## 2020-01-26 ENCOUNTER — Ambulatory Visit: Payer: No Typology Code available for payment source | Attending: Neurosurgery | Admitting: Physical Therapy

## 2020-01-26 ENCOUNTER — Encounter: Payer: Self-pay | Admitting: Physical Therapy

## 2020-01-26 ENCOUNTER — Ambulatory Visit: Payer: No Typology Code available for payment source | Admitting: Physical Therapy

## 2020-01-26 ENCOUNTER — Other Ambulatory Visit: Payer: Self-pay

## 2020-01-26 ENCOUNTER — Telehealth: Payer: Self-pay | Admitting: Physical Therapy

## 2020-01-26 DIAGNOSIS — M62830 Muscle spasm of back: Secondary | ICD-10-CM | POA: Insufficient documentation

## 2020-01-26 DIAGNOSIS — R293 Abnormal posture: Secondary | ICD-10-CM | POA: Diagnosis present

## 2020-01-26 DIAGNOSIS — M5441 Lumbago with sciatica, right side: Secondary | ICD-10-CM | POA: Diagnosis present

## 2020-01-26 DIAGNOSIS — G8929 Other chronic pain: Secondary | ICD-10-CM | POA: Diagnosis present

## 2020-01-26 NOTE — Therapy (Signed)
[facility] [address] [place], [place], [address] Phone: [phone]   Fax:  [phone]  Physical Therapy Treatment  Patient Details  Name: Marcus Sanders MRN: [id number] Date of Birth: 08-15-1961 Referring Provider (PT): Hoyt Koch MD    Encounter Date: 01/26/2020  PT End of Session - 01/26/20 1700    Visit Number  11    Number of Visits  15    Date for PT Re-Evaluation  02/09/20    Authorization Type  15 visits authorized by [facility]    Authorization - Visit Number  8    Authorization - Number of Visits  15    PT Start Time  1626    PT Stop Time  1706    PT Time Calculation (min)  40 min    Activity Tolerance  Patient tolerated treatment well    Behavior During Therapy  [facility] for tasks assessed/performed       Past Medical History:  Diagnosis Date  . Diabetes mellitus without complication (HCC)     History reviewed. No pertinent surgical history.  There were no vitals filed for this visit.  Subjective Assessment - 01/26/20 1658    Subjective  Patient feles like things are getting better. He is standing longer with less pain into his buttock. He has been working in an attic without much pain    Limitations  Standing;Walking    How long can you walk comfortably?  does not cause    Currently in Pain?  No/denies         [facility] PT Assessment - 01/26/20 0001      Assessment   Medical Diagnosis  --    Referring Provider (PT)  --    Next MD Visit  --    Prior Therapy  --      Precautions   Precautions  --      Restrictions   Weight Bearing Restrictions  --      Home Environment   Additional Comments  --      Prior Function   Level of Independence  --    Vocation  --    Vocation Requirements  --    Leisure  --      Cognition   Overall Cognitive Status  --    Attention  --      Observation/Other Assessments   Focus on Therapeutic Outcomes (FOTO)   --      Sensation   Light Touch  --    Additional  Comments  --      Coordination   Gross Motor Movements are Fluid and Coordinated  --    Fine Motor Movements are Fluid and Coordinated  --      Posture/Postural Control   Posture Comments  --      AROM   Lumbar Flexion  --    Lumbar Extension  --    Lumbar - Right Side Bend  --    Lumbar - Left Side Bend  --    Lumbar - Right Rotation  --    Lumbar - Left Rotation  --      PROM   Overall PROM Comments  --      Strength   Overall Strength Comments  --      Flexibility   Soft Tissue Assessment /Muscle Length  --    Hamstrings  --      Palpation   Palpation comment  spasming into bilateral lumbar spine and  gluteals R> L; tenderenss to palpation with deep palpation of the right glutreal towards the pirifromis       Special Tests   Other special tests  SLR (-) bilateral       Ambulation/Gait   Gait Comments  ambualtes with trunk flexion and decreased bilateral hip flexion                    [facility] Adult PT Treatment/Exercise - 01/26/20 0001      Lumbar Exercises: Stretches   Passive Hamstring Stretch  Right;Left;2 reps;30 seconds    Piriformis Stretch  Right;Left;2 reps;30 seconds      Lumbar Exercises: Standing   Other Standing Lumbar Exercises  standing scap retraction 2x10 blue; shoulder extension 2x10 blue   with abdominal breathing ; chop on cables 5 kg x20 each side; pallof press 2x10 bilateral.     Other Standing Lumbar Exercises  standing squat x20       Lumbar Exercises: Supine   Clam Limitations  x20 blue     Bent Knee Raise  --    Bridge  15 reps    Straight Leg Raises Limitations  2x10 each leg with cuing for stabilization     Other Supine Lumbar Exercises  supine clam shell 2x10 blue       Manual Therapy   Manual Therapy  Joint mobilization;Soft tissue mobilization    Joint Mobilization  LAD bilat. s grade I-IV oscillations, lumbar PAs grade I-IV, lumbar side glides grade I-IV bilat. in sidelying;     Soft tissue mobilization  STM lumbar  paraspinals, IASTM/roller use use bilateral gluts, hamstrings, piriformis; deep trigger point release to gluteals                PT Short Term Goals - 01/26/20 1703      PT SHORT TERM GOAL #1   Title  Patient will increase90/90 hamstring length by 20 degrees    Time  3    Period  Weeks    Status  On-going    Target Date  01/05/20      PT SHORT TERM GOAL #2   Title  Patient will report no radiating pain down his right leg    Time  3    Period  Weeks    Status  On-going    Target Date  01/05/20      PT SHORT TERM GOAL #3   Title  Patient will independent with basci stretching and strengthening program  program    Time  3    Period  Weeks    Status  On-going    Target Date  01/05/20        PT Long Term Goals - 12/15/19 0827      PT LONG TERM GOAL #1   Title  Patient will stand for 1 hour at work without self treported pain    Time  8    Period  Weeks    Target Date  02/09/20      PT LONG TERM GOAL #2   Title  Patient will sit in tight spaces at work without selft report of increased low back stiffness    Time  8    Period  Weeks    Status  New    Target Date  02/09/20            Plan - 01/26/20 1701    Clinical Impression Statement  therapy advanced difficulty of standing exercises today. He tolerated  well. he had minor pain into his buttock. Therapy continues to to focus on manual therapy and core strengthening. He had more spasming on left side then the right today. Therapy will needle the left side next visit.    Personal Factors and Comorbidities  Comorbidity 1;Comorbidity 2    Comorbidities  peripheral neuropathy; right pinkie toe amputation    Examination-Activity Limitations  Stand    Examination-Participation Restrictions  Meal Prep    Stability/Clinical Decision Making  Evolving/Moderate complexity    Clinical Decision Making  Low    Rehab Potential  Good    PT Frequency  2x / week    PT Duration  8 weeks    PT Treatment/Interventions   ADLs/Self Care Home Management;Cryotherapy;Electrical Stimulation;Ultrasound;Moist Heat;Functional mobility training;Therapeutic activities;Therapeutic exercise;Neuromuscular re-education;Patient/family education;Manual techniques;Dry needling;Passive range of motion;Iontophoresis 4mg /ml Dexamethasone;Spinal Manipulations    PT Next Visit Plan  continue to work on spnal mobilty and increasing difficulty of core exercises; consider needling next visit.    PT Home Exercise Plan  hamstring stretch; tennis ball trigger point release, piriformins stretch    Consulted and Agree with Plan of Care  Patient       Patient will benefit from skilled therapeutic intervention in order to improve the following deficits and impairments:  Abnormal gait, Decreased endurance, Pain, Postural dysfunction, Decreased range of motion  Visit Diagnosis: Chronic right-sided low back pain with right-sided sciatica  Muscle spasm of back  Abnormal posture     Problem List There are no problems to display for this patient.   Carney Living PT DPT  01/26/2020, 5:09 PM  [facility] [address] [place], [place], [address] Phone: [phone]   Fax:  [phone]  Name: Marcus Sanders MRN: [id number] Date of Birth: 07/21/1961

## 2020-01-26 NOTE — Telephone Encounter (Signed)
Called patient regarding no show for therapy appointment-he reports forgot/got days mixed up. May call back to see if any openings come up this PM otherwise will return as scheduled next week.

## 2020-01-31 ENCOUNTER — Other Ambulatory Visit: Payer: Self-pay

## 2020-01-31 ENCOUNTER — Ambulatory Visit: Payer: No Typology Code available for payment source | Admitting: Physical Therapy

## 2020-01-31 DIAGNOSIS — R293 Abnormal posture: Secondary | ICD-10-CM

## 2020-01-31 DIAGNOSIS — M5441 Lumbago with sciatica, right side: Secondary | ICD-10-CM

## 2020-01-31 DIAGNOSIS — G8929 Other chronic pain: Secondary | ICD-10-CM

## 2020-01-31 DIAGNOSIS — M62830 Muscle spasm of back: Secondary | ICD-10-CM

## 2020-01-31 NOTE — Therapy (Signed)
[place], [place], [address] Phone: [phone]   Fax:  [phone]  Physical Therapy Treatment  Patient Details  Name: Marcus Sanders MRN: [id number] Date of Birth: 04/05/1961 Referring Provider (PT): Duffy Rhody MD    Encounter Date: 01/31/2020  PT End of Session - 01/31/20 1223    Visit Number  12    Number of Visits  15    Date for PT Re-Evaluation  02/09/20    Authorization Type  15 visits authorized by [facility] - Visit Number  12    Authorization - Number of Visits  16    PT Start Time  1010    PT Stop Time  1051    PT Time Calculation (min)  41 min    Activity Tolerance  Patient tolerated treatment well    Behavior During Therapy  [facility] for tasks assessed/performed       Past Medical History:  Diagnosis Date  . Diabetes mellitus without complication ([place])     No past surgical history on file.  There were no vitals filed for this visit.  Subjective Assessment - 01/31/20 1217    Subjective  Patient reports he has very little pain at all today. He has been working on his exercises.    Limitations  Standing;Walking    Currently in Pain?  No/denies                       [facility] Adult PT Treatment/Exercise - 01/31/20 0001      Lumbar Exercises: Stretches   Passive Hamstring Stretch  Right;Left;2 reps;30 seconds    Piriformis Stretch  Right;Left;2 reps;30 seconds      Lumbar Exercises: Standing   Other Standing Lumbar Exercises  standing scap retraction 2x10 black; shoulder extension 2x10 black   with abdominal breathing ; chop on cables 5 kg x20 each side; pallof press 2x10 bilateral. 4.5 kg     Other Standing Lumbar Exercises  standing squat x20       Lumbar Exercises: Supine   Clam Limitations  x20 blue     Bridge  20 reps    Straight Leg Raises Limitations  x20 each leg       Manual Therapy   Manual Therapy  Joint mobilization;Soft tissue mobilization    Joint Mobilization  LAD bilat. s grade I-IV oscillations, lumbar PAs grade I-IV, lumbar side glides grade I-IV bilat. in sidelying;     Soft tissue mobilization  STM lumbar paraspinals, IASTM/roller use use bilateral gluts, hamstrings, piriformis; deep trigger point release to gluteals                PT Short Term Goals - 01/26/20 1703      PT SHORT TERM GOAL #1   Title  Patient will increase90/90 hamstring length by 20 degrees    Time  3    Period  Weeks    Status  On-going    Target Date  01/05/20      PT SHORT TERM GOAL #2   Title  Patient will report no radiating pain down his right leg    Time  3    Period  Weeks    Status  On-going    Target Date  01/05/20      PT SHORT TERM GOAL #3   Title  Patient will independent with basci stretching and strengthening program  program    Time  3  Period  Weeks    Status  On-going    Target Date  01/05/20        PT Long Term Goals - 12/15/19 0827      PT LONG TERM GOAL #1   Title  Patient will stand for 1 hour at work without self treported pain    Time  8    Period  Weeks    Target Date  02/09/20      PT LONG TERM GOAL #2   Title  Patient will sit in tight spaces at work without selft report of increased low back stiffness    Time  8    Period  Weeks    Status  New    Target Date  02/09/20            Plan - 01/31/20 1205    Clinical Impression Statement  therapy had initially planned on dry needling the patients leftglteal but he had no trigger points today. Therapy instead focused on manual therapy to lower lumbar spine and advanced his strengthening. He was advanced to black band exercises.    Personal Factors and Comorbidities  Comorbidity 1;Comorbidity 2    Comorbidities  peripheral neuropathy; right pinkie toe amputation    Examination-Activity Limitations  Stand    Examination-Participation Restrictions  Meal Prep    Stability/Clinical Decision Making  Evolving/Moderate complexity    Clinical  Decision Making  Low    Rehab Potential  Good    PT Frequency  2x / week    PT Duration  8 weeks    PT Treatment/Interventions  ADLs/Self Care Home Management;Cryotherapy;Electrical Stimulation;Ultrasound;Moist Heat;Functional mobility training;Therapeutic activities;Therapeutic exercise;Neuromuscular re-education;Patient/family education;Manual techniques;Dry needling;Passive range of motion;Iontophoresis 4mg /ml Dexamethasone;Spinal Manipulations    PT Next Visit Plan  continue to work on spnal mobilty and increasing difficulty of core exercises; consider needling next visit.    PT Home Exercise Plan  hamstring stretch; tennis ball trigger point release, piriformins stretch    Consulted and Agree with Plan of Care  Patient       Patient will benefit from skilled therapeutic intervention in order to improve the following deficits and impairments:  Abnormal gait, Decreased endurance, Pain, Postural dysfunction, Decreased range of motion  Visit Diagnosis: Chronic right-sided low back pain with right-sided sciatica  Muscle spasm of back  Abnormal posture     Problem List There are no problems to display for this patient.   PT DPT 01/31/2020, 12:24 PM  [facility] [address] [place], [place], [place] Phone: [phone]   Fax:  [phone]  Name: Marcus Sanders MRN: [name] Date of Birth: 30-Dec-1960

## 2020-02-03 ENCOUNTER — Ambulatory Visit: Payer: No Typology Code available for payment source | Admitting: Physical Therapy

## 2020-02-03 ENCOUNTER — Encounter: Payer: Self-pay | Admitting: Physical Therapy

## 2020-02-03 ENCOUNTER — Other Ambulatory Visit: Payer: Self-pay

## 2020-02-03 DIAGNOSIS — M62830 Muscle spasm of back: Secondary | ICD-10-CM

## 2020-02-03 DIAGNOSIS — M5441 Lumbago with sciatica, right side: Secondary | ICD-10-CM | POA: Diagnosis not present

## 2020-02-03 DIAGNOSIS — R293 Abnormal posture: Secondary | ICD-10-CM

## 2020-02-03 DIAGNOSIS — G8929 Other chronic pain: Secondary | ICD-10-CM

## 2020-02-03 NOTE — Therapy (Signed)
[facility] Outpatient Rehabilitation [facility] [address] [place], [place], [address] Phone: [phone]   Fax:  [phone]  Physical Therapy Treatment  Patient Details  Name: Marcus Sanders MRN: [id number] Date of Birth: [birth year] Referring Provider (PT): Hoyt Koch MD    Encounter Date: 02/03/2020  PT End of Session - 02/03/20 1015    Visit Number  13    Number of Visits  15    Date for PT Re-Evaluation  02/09/20    Authorization Type  15 visits authorized by [facility]    Authorization - Visit Number  13    Authorization - Number of Visits  16    PT Start Time  1014    PT Stop Time  1056    PT Time Calculation (min)  42 min    Activity Tolerance  Patient tolerated treatment well    Behavior During Therapy  [facility] for tasks assessed/performed       Past Medical History:  Diagnosis Date  . Diabetes mellitus without complication (HCC)     History reviewed. No pertinent surgical history.  There were no vitals filed for this visit.  Subjective Assessment - 02/03/20 1013    Subjective  Patient reports his back is imporving. He still has some tightness into his legs when he stands for too long but the pain in his buttock has improved.    Limitations  Standing;Walking    How long can you walk comfortably?  does not cause    Currently in Pain?  No/denies                       [facility] Adult PT Treatment/Exercise - 02/03/20 0001      Lumbar Exercises: Stretches   Passive Hamstring Stretch  Right;Left;2 reps;30 seconds    Piriformis Stretch  Right;Left;2 reps;30 seconds      Lumbar Exercises: Standing   Other Standing Lumbar Exercises  standing scap retraction 2x10 black; shoulder extension 2x10 black   with abdominal breathing ; chop on cables 5 kg x20 each side; pallof press 2x10 bilateral. 4.5 kg     Other Standing Lumbar Exercises  standing squat x20       Lumbar Exercises: Supine   Clam Limitations  x20 blue     Bridge  20 reps    Straight  Leg Raises Limitations  x20 each leg       Manual Therapy   Manual Therapy  Joint mobilization;Soft tissue mobilization    Joint Mobilization  LAD bilat. s grade I-IV oscillations, lumbar PAs grade I-IV, lumbar side glides grade I-IV bilat. in sidelying;     Soft tissue mobilization  STM lumbar paraspinals, IASTM/roller use use bilateral gluts, hamstrings, piriformis; deep trigger point release to gluteals              PT Education - 02/03/20 1015    Education Details  reviewed HEp and syptom mangement    Person(s) Educated  Patient    Methods  Explanation;Demonstration;Tactile cues;Verbal cues    Comprehension  Verbalized understanding;Returned demonstration;Tactile cues required;Verbal cues required       PT Short Term Goals - 01/26/20 1703      PT SHORT TERM GOAL #1   Title  Patient will increase90/90 hamstring length by 20 degrees    Time  3    Period  Weeks    Status  On-going    Target Date  01/05/20      PT SHORT TERM GOAL #  2   Title  Patient will report no radiating pain down his right leg    Time  3    Period  Weeks    Status  On-going    Target Date  01/05/20      PT SHORT TERM GOAL #3   Title  Patient will independent with basci stretching and strengthening program  program    Time  3    Period  Weeks    Status  On-going    Target Date  01/05/20        PT Long Term Goals - 02/03/20 1033      PT LONG TERM GOAL #1   Title  Patient will stand for 1 hour at work without self treported pain    Time  8    Period  Weeks    Status  On-going      PT LONG TERM GOAL #2   Title  Patient will sit in tight spaces at work without selft report of increased low back stiffness    Time  8    Period  Weeks    Status  On-going            Plan - 02/03/20 1018    Clinical Impression Statement  Patient continues to make progress. He knows his exercises well. His pain is well controlled. Therapy willcontinue with manual therapy for 3 more visits. Patient is  progreesing towards discharge.    Personal Factors and Comorbidities  Comorbidity 1;Comorbidity 2    Comorbidities  peripheral neuropathy; right pinkie toe amputation    Examination-Activity Limitations  Stand    Examination-Participation Restrictions  Meal Prep    Stability/Clinical Decision Making  Evolving/Moderate complexity    Rehab Potential  Good    PT Frequency  2x / week    PT Duration  8 weeks    PT Treatment/Interventions  ADLs/Self Care Home Management;Cryotherapy;Electrical Stimulation;Ultrasound;Moist Heat;Functional mobility training;Therapeutic activities;Therapeutic exercise;Neuromuscular re-education;Patient/family education;Manual techniques;Dry needling;Passive range of motion;Iontophoresis 4mg /ml Dexamethasone;Spinal Manipulations    PT Next Visit Plan  continue to work on spnal mobilty and increasing difficulty of core exercises; consider needling next visit.    PT Home Exercise Plan  hamstring stretch; tennis ball trigger point release, piriformins stretch    Consulted and Agree with Plan of Care  Patient       Patient will benefit from skilled therapeutic intervention in order to improve the following deficits and impairments:  Abnormal gait, Decreased endurance, Pain, Postural dysfunction, Decreased range of motion  Visit Diagnosis: Chronic right-sided low back pain with right-sided sciatica  Muscle spasm of back  Abnormal posture     Problem List There are no problems to display for this patient.   Carney Living PT DPT  02/03/2020, 10:49 AM  [facility] [address] [place], [place], [address] Phone: [phone]   Fax:  [phone]  Name: Marcus Sanders MRN: [id number] Date of Birth: 07-Jan-1961

## 2020-02-15 ENCOUNTER — Other Ambulatory Visit: Payer: Self-pay

## 2020-02-15 ENCOUNTER — Encounter: Payer: Self-pay | Admitting: Physical Therapy

## 2020-02-15 ENCOUNTER — Ambulatory Visit: Payer: No Typology Code available for payment source | Admitting: Physical Therapy

## 2020-02-15 DIAGNOSIS — R293 Abnormal posture: Secondary | ICD-10-CM

## 2020-02-15 DIAGNOSIS — G8929 Other chronic pain: Secondary | ICD-10-CM

## 2020-02-15 DIAGNOSIS — M5441 Lumbago with sciatica, right side: Secondary | ICD-10-CM | POA: Diagnosis not present

## 2020-02-15 DIAGNOSIS — M62830 Muscle spasm of back: Secondary | ICD-10-CM

## 2020-02-16 ENCOUNTER — Encounter: Payer: Self-pay | Admitting: Physical Therapy

## 2020-02-16 NOTE — Therapy (Addendum)
[facility] Outpatient Rehabilitation [facility] [address] [place], [place], [address] Phone: [phone]   Fax:  [phone]  Physical Therapy Treatment  Patient Details  Name: Marcus Sanders MRN: [id number] Date of Birth: 04/17/1961 Referring Provider (PT): Hoyt Koch MD    Encounter Date: 02/15/2020  PT End of Session - 02/16/20 0717    Visit Number  14    Number of Visits  15    Date for PT Re-Evaluation  02/09/20    Authorization Type  15 visits authorized by [facility]    Authorization - Visit Number  14    Authorization - Number of Visits  16    PT Start Time  1100    PT Stop Time  1143    PT Time Calculation (min)  43 min    Activity Tolerance  Patient tolerated treatment well    Behavior During Therapy  [facility] for tasks assessed/performed       Past Medical History:  Diagnosis Date  . Diabetes mellitus without complication (HCC)     History reviewed. No pertinent surgical history.  There were no vitals filed for this visit.  Subjective Assessment - 02/15/20 1129    Subjective  Patient reports that his back pain has increased over the past 5 days. His back is hurting him more when he stands.    Pertinent History  Pain in the lower back    How long can you walk comfortably?  does not cause    Currently in Pain?  Yes    Pain Score  4     Pain Location  Back    Pain Orientation  Right    Pain Descriptors / Indicators  Aching    Pain Type  Chronic pain    Pain Onset  More than a month ago    Pain Frequency  Intermittent    Aggravating Factors   standing and walking    Pain Relieving Factors  sitting    Effect of Pain on Daily Activities  limits standing    Multiple Pain Sites  No                       [facility] Adult PT Treatment/Exercise - 02/16/20 0001      Lumbar Exercises: Stretches   Passive Hamstring Stretch  Right;Left;2 reps;30 seconds    Piriformis Stretch  Right;Left;2 reps;30 seconds      Lumbar Exercises: Supine   AB  Set Limitations  reviewed proper breathing  ther-ex     Clam Limitations  x20 blue     Bridge  20 reps    Straight Leg Raises Limitations  x20 each leg        Trigger Point Dry Needling - 02/16/20 0001    Consent Given?  Yes    Other Dry Needling  3 spots in gluteal     Gluteus Medius Response  Twitch response elicited;Palpable increased muscle length           PT Education - 02/15/20 1135    Education Details  HEP and    Person(s) Educated  Patient    Methods  Explanation;Demonstration;Tactile cues;Verbal cues    Comprehension  Verbalized understanding;Returned demonstration;Verbal cues required;Tactile cues required       PT Short Term Goals - 02/16/20 0741      PT SHORT TERM GOAL #1   Title  Patient will increase90/90 hamstring length by 20 degrees    Time  3  Period  Weeks    Status  On-going    Target Date  01/05/20      PT SHORT TERM GOAL #2   Title  Patient will report no radiating pain down his right leg    Time  3    Period  Weeks    Status  On-going    Target Date  01/05/20      PT SHORT TERM GOAL #3   Title  Patient will independent with basci stretching and strengthening program  program    Time  3    Period  Weeks    Status  On-going    Target Date  01/05/20        PT Long Term Goals - 02/03/20 1033      PT LONG TERM GOAL #1   Title  Patient will stand for 1 hour at work without self treported pain    Time  8    Period  Weeks    Status  On-going      PT LONG TERM GOAL #2   Title  Patient will sit in tight spaces at work without selft report of increased low back stiffness    Time  8    Period  Weeks    Status  On-going            Plan - 02/16/20 0719    Clinical Impression Statement  Patient had increased pain today. He has had pain for the past 3 days. His pain at this time is severe when he is standing. Therapy perfromed trigger point dry needling to his gluteals and got a great twitch respsonse he felt better after therapy.  Patient advised to try to make sure he works on his stretches and exercises at home to see if he can get himself back to prior level of pain control. If he can not he will likley be sent back to MD for further evalaution.    Personal Factors and Comorbidities  Comorbidity 1;Comorbidity 2    Comorbidities  peripheral neuropathy; right pinkie toe amputation    Stability/Clinical Decision Making  Evolving/Moderate complexity    Clinical Decision Making  Low    Rehab Potential  Good    PT Frequency  2x / week    PT Duration  8 weeks    PT Treatment/Interventions  ADLs/Self Care Home Management;Cryotherapy;Electrical Stimulation;Ultrasound;Moist Heat;Functional mobility training;Therapeutic activities;Therapeutic exercise;Neuromuscular re-education;Patient/family education;Manual techniques;Dry needling;Passive range of motion;Iontophoresis 4mg /ml Dexamethasone;Spinal Manipulations    PT Next Visit Plan  continue to work on spnal mobilty and increasing difficulty of core exercises; consider needling next visit.    PT Home Exercise Plan  hamstring stretch; tennis ball trigger point release, piriformins stretch    Consulted and Agree with Plan of Care  Patient       Patient will benefit from skilled therapeutic intervention in order to improve the following deficits and impairments:  Abnormal gait, Decreased endurance, Pain, Postural dysfunction, Decreased range of motion  Visit Diagnosis: Chronic right-sided low back pain with right-sided sciatica  Muscle spasm of back  Abnormal posture     Problem List There are no problems to display for this patient.   Carney Living PT DPT  02/16/2020, 7:48 AM  [facility] [address] [place], [place], [address] Phone: [phone]   Fax:  [phone]  Name: Marcus Sanders MRN: [id number] Date of Birth: December 02, 1960

## 2020-02-22 ENCOUNTER — Ambulatory Visit: Payer: No Typology Code available for payment source | Admitting: Physical Therapy

## 2020-02-22 ENCOUNTER — Other Ambulatory Visit: Payer: Self-pay

## 2020-02-22 ENCOUNTER — Encounter: Payer: Self-pay | Admitting: Physical Therapy

## 2020-02-22 DIAGNOSIS — R293 Abnormal posture: Secondary | ICD-10-CM

## 2020-02-22 DIAGNOSIS — G8929 Other chronic pain: Secondary | ICD-10-CM

## 2020-02-22 DIAGNOSIS — M5441 Lumbago with sciatica, right side: Secondary | ICD-10-CM

## 2020-02-22 DIAGNOSIS — M62830 Muscle spasm of back: Secondary | ICD-10-CM

## 2020-02-22 NOTE — Therapy (Signed)
[place], [place], [address] Phone: [phone]   Fax:  [phone]  Physical Therapy Treatment  Patient Details  Name: Marcus Sanders MRN: [id number] Date of Birth: 10/30/1960 Referring Provider (PT): Duffy Rhody MD    Encounter Date: 02/22/2020  PT End of Session - 02/22/20 1225    Visit Number  15    Number of Visits  15    Date for PT Re-Evaluation  02/09/20    Authorization Type  15 visits authorized by [facility] Re-eval next visit    Authorization - Visit Number  15    Authorization - Number of Visits  16    PT Start Time  1100    PT Stop Time  1142    PT Time Calculation (min)  42 min    Activity Tolerance  Patient tolerated treatment well    Behavior During Therapy  [facility] for tasks assessed/performed       Past Medical History:  Diagnosis Date  . Diabetes mellitus without complication ([place])     History reviewed. No pertinent surgical history.  There were no vitals filed for this visit.  Subjective Assessment - 02/22/20 1101    Subjective  Patient reports his pain has been manageable. Since the needling and the soft tissue work his pain has been better.    Pertinent History  Pain in the lower back    Limitations  Standing;Walking    How long can you walk comfortably?  does not cause    Currently in Pain?  No/denies   not unless he is standing                      [facility] Adult PT Treatment/Exercise - 02/22/20 0001      Lumbar Exercises: Stretches   Passive Hamstring Stretch  Right;Left;2 reps;30 seconds    Piriformis Stretch  Right;Left;2 reps;30 seconds      Lumbar Exercises: Standing   Other Standing Lumbar Exercises  standing scap retraction 2x10 black; shoulder extension 2x10 black   with abdominal breathing ; chop on cables 5 kg x20 each side; pallof press 2x10 bilateral. 4.5 kg       Lumbar Exercises: Supine   AB Set Limitations  reviewed proper breathing  ther-ex      Clam Limitations  x20 blue     Bridge  20 reps    Straight Leg Raises Limitations  x20 each leg       Manual Therapy   Joint Mobilization  LAD bilat. s grade I-IV oscillations, lumbar PAs grade I-IV, lumbar side glides grade I-IV bilat. in sidelying;     Soft tissue mobilization  STM lumbar paraspinals, IASTM/roller use use bilateral gluts, hamstrings, piriformis; deep trigger point release to gluteals        Trigger Point Dry Needling - 02/22/20 0001    Consent Given?  Yes    Other Dry Needling  3 spots in gluteal     Gluteus Medius Response  Twitch response elicited;Palpable increased muscle length           PT Education - 02/22/20 1105    Education Details  reviewed HEP and symptom mangement    Person(s) Educated  Patient    Methods  Explanation;Demonstration;Tactile cues;Verbal cues    Comprehension  Verbalized understanding;Returned demonstration;Verbal cues required;Tactile cues required       PT Short Term Goals - 02/16/20 0741      PT SHORT TERM GOAL #1  Title  Patient will increase90/90 hamstring length by 20 degrees    Time  3    Period  Weeks    Status  On-going    Target Date  01/05/20      PT SHORT TERM GOAL #2   Title  Patient will report no radiating pain down his right leg    Time  3    Period  Weeks    Status  On-going    Target Date  01/05/20      PT SHORT TERM GOAL #3   Title  Patient will independent with basci stretching and strengthening program  program    Time  3    Period  Weeks    Status  On-going    Target Date  01/05/20        PT Long Term Goals - 02/03/20 1033      PT LONG TERM GOAL #1   Title  Patient will stand for 1 hour at work without self treported pain    Time  8    Period  Weeks    Status  On-going      PT LONG TERM GOAL #2   Title  Patient will sit in tight spaces at work without selft report of increased low back stiffness    Time  8    Period  Weeks    Status  On-going            Plan - 02/22/20  1227    Clinical Impression Statement  Therapy was able to advance patient back to higher level ther-ex today. He had a great twitch respose with dry needlging. he feels like the needling has really helped. Therapy will review HEP with patient in preperation for D/C to HEP .    Personal Factors and Comorbidities  Comorbidity 1;Comorbidity 2    Comorbidities  peripheral neuropathy; right pinkie toe amputation    Examination-Participation Restrictions  Meal Prep    Stability/Clinical Decision Making  Evolving/Moderate complexity    Clinical Decision Making  Low    Rehab Potential  Good    PT Frequency  2x / week    PT Duration  8 weeks    PT Treatment/Interventions  ADLs/Self Care Home Management;Cryotherapy;Electrical Stimulation;Ultrasound;Moist Heat;Functional mobility training;Therapeutic activities;Therapeutic exercise;Neuromuscular re-education;Patient/family education;Manual techniques;Dry needling;Passive range of motion;Iontophoresis 4mg /ml Dexamethasone;Spinal Manipulations    PT Next Visit Plan  continue to work on spnal mobilty and increasing difficulty of core exercises; consider needling next visit.    PT Home Exercise Plan  hamstring stretch; tennis ball trigger point release, piriformins stretch    Consulted and Agree with Plan of Care  Patient       Patient will benefit from skilled therapeutic intervention in order to improve the following deficits and impairments:  Abnormal gait, Decreased endurance, Pain, Postural dysfunction, Decreased range of motion  Visit Diagnosis: Chronic right-sided low back pain with right-sided sciatica  Muscle spasm of back  Abnormal posture     Problem List There are no problems to display for this patient.   02/22/2020, 12:32 PM  [facility] [address] [place], [place], [place] Phone: [phone]   Fax:  [phone]  Name: Marcus Sanders MRN: [name] Date of  Birth: 10-30-1960

## 2020-02-29 ENCOUNTER — Encounter: Payer: Self-pay | Admitting: Physical Therapy

## 2020-02-29 ENCOUNTER — Ambulatory Visit: Payer: No Typology Code available for payment source | Attending: Neurosurgery | Admitting: Physical Therapy

## 2020-02-29 ENCOUNTER — Other Ambulatory Visit: Payer: Self-pay

## 2020-02-29 DIAGNOSIS — M62830 Muscle spasm of back: Secondary | ICD-10-CM | POA: Insufficient documentation

## 2020-02-29 DIAGNOSIS — M5441 Lumbago with sciatica, right side: Secondary | ICD-10-CM | POA: Insufficient documentation

## 2020-02-29 DIAGNOSIS — R293 Abnormal posture: Secondary | ICD-10-CM | POA: Insufficient documentation

## 2020-02-29 DIAGNOSIS — G8929 Other chronic pain: Secondary | ICD-10-CM

## 2020-02-29 NOTE — Therapy (Addendum)
[place], [place], [address] Phone: [phone]   Fax:  [phone]  Physical Therapy Treatment/Discharge   Patient Details  Name: Marcus Sanders MRN: [id number] Date of Birth: 10-Apr-1961 Referring Provider (PT): Duffy Rhody MD    Encounter Date: 02/29/2020  PT End of Session - 02/29/20 1425    Visit Number  16    Number of Visits  15    Date for PT Re-Evaluation  03/07/20    Authorization Type  15 visits authorized by [facility] Re-eval next visit    Authorization - Visit Number  36    Authorization - Number of Visits  16    PT Start Time  1015    PT Stop Time  1045   Patient needed to get back to work and felt comfortable with exercise program   PT Time Calculation (min)  30 min    Activity Tolerance  Patient tolerated treatment well    Behavior During Therapy  [facility] for tasks assessed/performed       Past Medical History:  Diagnosis Date  . Diabetes mellitus without complication ([place])     History reviewed. No pertinent surgical history.  There were no vitals filed for this visit.  Subjective Assessment - 02/29/20 1021    Subjective  Patient power washed his deck on Saturday. He reports he was sore for about 2 days but now he is better.    Pertinent History  Pain in the lower back    Limitations  Standing;Walking    How long can you walk comfortably?  does not cause    Currently in Pain?  Yes    Pain Score  2     Pain Location  Back    Pain Orientation  Right    Pain Descriptors / Indicators  Aching    Pain Type  Chronic pain    Pain Onset  More than a month ago    Pain Frequency  Intermittent    Aggravating Factors   standing and walking    Pain Relieving Factors  sitting    Effect of Pain on Daily Activities  limits standing                       [facility] Adult PT Treatment/Exercise - 02/29/20 0001      Lumbar Exercises: Stretches   Passive Hamstring Stretch  Right;Left;2  reps;30 seconds    Piriformis Stretch  Right;Left;2 reps;30 seconds      Lumbar Exercises: Standing   Other Standing Lumbar Exercises  reviewed standing exercises for HEP       Lumbar Exercises: Supine   AB Set Limitations  reviewed proper breathing  ther-ex     Clam Limitations  x20 blue     Bridge  20 reps    Straight Leg Raises Limitations  x20 each leg       Manual Therapy   Joint Mobilization  LAD bilat. s grade I-IV oscillations, lumbar PAs grade I-IV, lumbar side glides grade I-IV bilat. in sidelying;     Soft tissue mobilization  STM lumbar paraspinals, IASTM/roller use use bilateral gluts, hamstrings, piriformis; deep trigger point release to gluteals        Trigger Point Dry Needling - 02/29/20 0001    Consent Given?  Yes    Other Dry Needling  2 spots int he left and 2 spots int he right     Lumbar multifidi Response  Twitch response  elicited           PT Education - 02/29/20 1145    Education Details  reviewwed final HEP    Person(s) Educated  Patient    Methods  Explanation;Demonstration;Tactile cues;Verbal cues    Comprehension  Verbalized understanding;Returned demonstration;Verbal cues required;Tactile cues required       PT Short Term Goals - 02/16/20 0741      PT SHORT TERM GOAL #1   Title  Patient will increase90/90 hamstring length by 20 degrees    Time  3    Period  Weeks    Status  On-going    Target Date  01/05/20      PT SHORT TERM GOAL #2   Title  Patient will report no radiating pain down his right leg    Time  3    Period  Weeks    Status  On-going    Target Date  01/05/20      PT SHORT TERM GOAL #3   Title  Patient will independent with basci stretching and strengthening program  program    Time  3    Period  Weeks    Status  On-going    Target Date  01/05/20        PT Long Term Goals - 02/03/20 1033      PT LONG TERM GOAL #1   Title  Patient will stand for 1 hour at work without self treported pain    Time  8    Period   Weeks    Status  On-going      PT LONG TERM GOAL #2   Title  Patient will sit in tight spaces at work without selft report of increased low back stiffness    Time  8    Period  Weeks    Status  On-going            Plan - 02/29/20 1419    Clinical Impression Statement  Patient has reached max potential for therapy at this time. He has made significant functional improvement while standing at work but continues to hvae pain. He has responded well to manual therapy and TPDN but still continues to have pain when he stands for too long. he was advised he needs to be consitent and persitent with his exercisres and that should help with the time that he can stand for. He works long physical hours which makes it somewhat difficult to get into a consitent work out routine but he has been trying. He has been using his stretches on a more consitent basis then his exercises. He has a tennis ball for self trigger point release. He will be having his thrid injection soon. He feels confortbale with discharge at this time.    Personal Factors and Comorbidities  Comorbidity 1;Comorbidity 2    Comorbidities  peripheral neuropathy; right pinkie toe amputation    Examination-Activity Limitations  Stand    Stability/Clinical Decision Making  Evolving/Moderate complexity    Clinical Decision Making  Low    PT Frequency  2x / week    PT Duration  8 weeks    PT Treatment/Interventions  ADLs/Self Care Home Management;Cryotherapy;Electrical Stimulation;Ultrasound;Moist Heat;Functional mobility training;Therapeutic activities;Therapeutic exercise;Neuromuscular re-education;Patient/family education;Manual techniques;Dry needling;Passive range of motion;Iontophoresis 44m/ml Dexamethasone;Spinal Manipulations    PT Next Visit Plan  continue to work on spnal mobilty and increasing difficulty of core exercises; consider needling next visit.    PT Home Exercise Plan  hamstring stretch; tennis ball trigger  point release,  piriformins stretch    Consulted and Agree with Plan of Care  Patient       Patient will benefit from skilled therapeutic intervention in order to improve the following deficits and impairments:  Abnormal gait, Decreased endurance, Pain, Postural dysfunction, Decreased range of motion  Visit Diagnosis: Chronic right-sided low back pain with right-sided sciatica  Muscle spasm of back  Abnormal posture    PHYSICAL THERAPY DISCHARGE SUMMARY  Visits from Start of Care: 16  Current functional level related to goals / functional outcomes: Improved ability to stand and work    Remaining deficits: Continued pain but improved    Education / Equipment: HEP   Plan: Patient agrees to discharge.  Patient goals were met. Patient is being discharged due to being pleased with the current functional level.  ?????      Problem List There are no problems to display for this patient.   Carney Living PT DPT  02/29/2020, 2:48 PM  [facility] [address] [place], [place], [address] Phone: [phone]   Fax:  [phone]  Name: Marcus Sanders MRN: [id number] Date of Birth: 05/02/1961

## 2022-05-26 ENCOUNTER — Other Ambulatory Visit: Payer: Self-pay | Admitting: Otolaryngology

## 2022-06-17 ENCOUNTER — Other Ambulatory Visit: Payer: Self-pay

## 2022-06-17 ENCOUNTER — Encounter (HOSPITAL_COMMUNITY): Payer: Self-pay | Admitting: Otolaryngology

## 2022-06-17 NOTE — Progress Notes (Signed)
Marcus Sanders denies chest pain or shortness of breath. Patient denies having any s/s of Covid in his household, also denies any known exposure to Covid.   Marcus Sanders '[facility] [place], [place], patient reports that he had labs ran a week or so ago at th [place].Marland Kitchen Patient said that A1C was 6.2.

## 2022-06-18 ENCOUNTER — Ambulatory Visit (HOSPITAL_COMMUNITY): Payer: No Typology Code available for payment source | Admitting: Anesthesiology

## 2022-06-18 ENCOUNTER — Ambulatory Visit (HOSPITAL_COMMUNITY)
Admission: RE | Admit: 2022-06-18 | Discharge: 2022-06-18 | Disposition: A | Discharge status: Home / Self Care | Payer: No Typology Code available for payment source | Attending: Otolaryngology | Admitting: Otolaryngology

## 2022-06-18 ENCOUNTER — Ambulatory Visit (HOSPITAL_BASED_OUTPATIENT_CLINIC_OR_DEPARTMENT_OTHER): Payer: No Typology Code available for payment source | Admitting: Anesthesiology

## 2022-06-18 ENCOUNTER — Encounter (HOSPITAL_COMMUNITY)
Admission: RE | Disposition: A | Discharge status: Home / Self Care | Payer: Self-pay | Source: Home / Self Care | Attending: Otolaryngology

## 2022-06-18 ENCOUNTER — Encounter (HOSPITAL_COMMUNITY): Payer: Self-pay | Admitting: Otolaryngology

## 2022-06-18 DIAGNOSIS — E119 Type 2 diabetes mellitus without complications: Secondary | ICD-10-CM

## 2022-06-18 DIAGNOSIS — G4733 Obstructive sleep apnea (adult) (pediatric): Secondary | ICD-10-CM

## 2022-06-18 DIAGNOSIS — K219 Gastro-esophageal reflux disease without esophagitis: Secondary | ICD-10-CM | POA: Insufficient documentation

## 2022-06-18 DIAGNOSIS — I4510 Unspecified right bundle-branch block: Secondary | ICD-10-CM | POA: Diagnosis not present

## 2022-06-18 DIAGNOSIS — Z7984 Long term (current) use of oral hypoglycemic drugs: Secondary | ICD-10-CM | POA: Insufficient documentation

## 2022-06-18 DIAGNOSIS — F1290 Cannabis use, unspecified, uncomplicated: Secondary | ICD-10-CM | POA: Diagnosis not present

## 2022-06-18 HISTORY — DX: Unspecified osteoarthritis, unspecified site: M19.90

## 2022-06-18 HISTORY — DX: Gastro-esophageal reflux disease without esophagitis: K21.9

## 2022-06-18 HISTORY — DX: Attention-deficit hyperactivity disorder, unspecified type: F90.9

## 2022-06-18 HISTORY — PX: DRUG INDUCED ENDOSCOPY: SHX6808

## 2022-06-18 HISTORY — DX: Hyperlipidemia, unspecified: E78.5

## 2022-06-18 HISTORY — DX: Depression, unspecified: F32.A

## 2022-06-18 HISTORY — DX: Other complications of anesthesia, initial encounter: T88.59XA

## 2022-06-18 LAB — GLUCOSE, CAPILLARY
Glucose-Capillary: 154 mg/dL — ABNORMAL HIGH (ref 70–99)
Glucose-Capillary: 128 mg/dL — ABNORMAL HIGH (ref 70–99)

## 2022-06-18 SURGERY — DRUG INDUCED SLEEP ENDOSCOPY
Anesthesia: Monitor Anesthesia Care | Laterality: Bilateral

## 2022-06-18 MED ORDER — OXYMETAZOLINE HCL 0.05 % NA SOLN
NASAL | Status: DC | PRN
  Administered 2022-06-18: 1 via TOPICAL

## 2022-06-18 MED ORDER — ORAL CARE MOUTH RINSE: 15.0000 mL | Freq: Once | OROMUCOSAL | Status: AC

## 2022-06-18 MED ORDER — ONDANSETRON HCL 4 MG/2ML IJ SOLN: 4.0000 mg | Freq: Once | INTRAMUSCULAR | Status: DC | PRN

## 2022-06-18 MED ORDER — PROPOFOL 500 MG/50ML IV EMUL
INTRAVENOUS | Status: DC | PRN
  Administered 2022-06-18: 75 ug/kg/min via INTRAVENOUS

## 2022-06-18 MED ORDER — AMISULPRIDE (ANTIEMETIC) 5 MG/2ML IV SOLN: 10.0000 mg | Freq: Once | INTRAVENOUS | Status: DC | PRN

## 2022-06-18 MED ORDER — INSULIN ASPART 100 UNIT/ML IJ SOLN
INTRAMUSCULAR | Status: AC
  Filled 2022-06-18: qty 1

## 2022-06-18 MED ORDER — CHLORHEXIDINE GLUCONATE 0.12 % MT SOLN
15.0000 mL | Freq: Once | OROMUCOSAL | Status: AC
Start: 2022-06-18 — End: 2022-06-18
  Administered 2022-06-18: 15 mL via OROMUCOSAL
  Filled 2022-06-18: qty 15

## 2022-06-18 MED ORDER — OXYMETAZOLINE HCL 0.05 % NA SOLN
NASAL | Status: AC
  Filled 2022-06-18: qty 30

## 2022-06-18 MED ORDER — LIDOCAINE 2% (20 MG/ML) 5 ML SYRINGE
INTRAMUSCULAR | Status: DC | PRN
  Administered 2022-06-18: 40 mg via INTRAVENOUS

## 2022-06-18 MED ORDER — LACTATED RINGERS IV SOLN: INTRAVENOUS | Status: DC

## 2022-06-18 MED ORDER — FENTANYL CITRATE (PF) 100 MCG/2ML IJ SOLN: 25.0000 ug | INTRAMUSCULAR | Status: DC | PRN

## 2022-06-18 MED ORDER — INSULIN ASPART 100 UNIT/ML IJ SOLN
0.0000 [IU] | INTRAMUSCULAR | Status: DC | PRN
  Administered 2022-06-18: 2 [IU] via SUBCUTANEOUS

## 2022-06-18 SURGICAL SUPPLY — 14 items
BAG COUNTER SPONGE SURGICOUNT (BAG) IMPLANT
CANISTER SUCT 1200ML W/VALVE (MISCELLANEOUS) ×1 IMPLANT
COVER MAYO STAND STRL (DRAPES) IMPLANT
GLOVE BIO SURGEON STRL SZ7.5 (GLOVE) ×1 IMPLANT
KIT BASIN OR (CUSTOM PROCEDURE TRAY) ×1 IMPLANT
NDL PRECISIONGLIDE 27X1.5 (NEEDLE) IMPLANT
NEEDLE PRECISIONGLIDE 27X1.5 (NEEDLE) IMPLANT
PATTIES SURGICAL .5 X3 (DISPOSABLE) ×1 IMPLANT
SHEET MEDIUM DRAPE 40X70 STRL (DRAPES) IMPLANT
SOL ANTI FOG 6CC (MISCELLANEOUS) ×1 IMPLANT
SOLUTION ANTI FOG 6CC (MISCELLANEOUS) ×1
SYR CONTROL 10ML LL (SYRINGE) IMPLANT
TOWEL GREEN STERILE FF (TOWEL DISPOSABLE) ×1 IMPLANT
TUBE CONNECTING 20X1/4 (TUBING) ×1 IMPLANT

## 2022-06-18 NOTE — Anesthesia Preprocedure Evaluation (Signed)
Anesthesia Evaluation  Patient identified by MRN, date of birth, ID band Patient awake    Reviewed: Allergy & Precautions, NPO status , Patient's Chart, lab work & pertinent test results  History of Anesthesia Complications Negative for: history of anesthetic complications  Airway Mallampati: III  TM Distance: >3 FB Neck ROM: Full    Dental  (+) Edentulous Upper, Edentulous Lower   Pulmonary neg pulmonary ROS,    Pulmonary exam normal        Cardiovascular negative cardio ROS Normal cardiovascular exam     Neuro/Psych negative neurological ROS     GI/Hepatic Neg liver ROS, GERD  ,  Endo/Other  diabetes, Oral Hypoglycemic Agents  Renal/GU negative Renal ROS  negative genitourinary   Musculoskeletal negative musculoskeletal ROS (+)   Abdominal   Peds  Hematology negative hematology ROS (+)   Anesthesia Other Findings   Reproductive/Obstetrics                           Anesthesia Physical Anesthesia Plan  ASA: 2  Anesthesia Plan: MAC   Post-op Pain Management: Minimal or no pain anticipated   Induction: Intravenous  PONV Risk Score and Plan: 1 and Propofol infusion, TIVA and Treatment may vary due to age or medical condition  Airway Management Planned: Natural Airway, Nasal Cannula and Simple Face Mask  Additional Equipment: None  Intra-op Plan:   Post-operative Plan:   Informed Consent: I have reviewed the patients History and Physical, chart, labs and discussed the procedure including the risks, benefits and alternatives for the proposed anesthesia with the patient or authorized representative who has indicated his/her understanding and acceptance.       Plan Discussed with:   Anesthesia Plan Comments:         Anesthesia Quick Evaluation

## 2022-06-18 NOTE — Brief Op Note (Signed)
06/18/2022  10:06 AM  PATIENT:  Jennette Banker  61 y.o. male  PRE-OPERATIVE DIAGNOSIS:  Obstructive Sleep Apnea  POST-OPERATIVE DIAGNOSIS:  same  PROCEDURE:  Procedure(s): DRUG INDUCED ENDOSCOPY (Bilateral)  SURGEON:  Surgeon(s) and Role:    Christia Reading, MD - Primary  PHYSICIAN ASSISTANT:   ASSISTANTS: none   ANESTHESIA:   IV sedation  EBL:  None   BLOOD ADMINISTERED:none  DRAINS: none   LOCAL MEDICATIONS USED:  None  SPECIMEN:  No Specimen  DISPOSITION OF SPECIMEN:  N/A  COUNTS:  YES  TOURNIQUET:  * No tourniquets in log *  DICTATION: .Note written in EPIC  PLAN OF CARE: Discharge to home after PACU  PATIENT DISPOSITION:  PACU - hemodynamically stable.   Delay start of Pharmacological VTE agent (>24hrs) due to surgical blood loss or risk of bleeding: no

## 2022-06-18 NOTE — Progress Notes (Signed)
No labs needed per Dr. Clemens Catholic.   Viviano Simas, RN

## 2022-06-18 NOTE — H&P (Signed)
Marcus Sanders is an 61 y.o. male.   Chief Complaint: Sleep apnea HPI: 61 year old male with sleep apnea who has been unable to tolerate CPAP.  Past Medical History:  Diagnosis Date   ADHD (attention deficit hyperactivity disorder)    Arthritis    Complication of anesthesia    if not under deep enough , I start snorning   Depression    Diabetes mellitus without complication ([facility])    GERD (gastroesophageal reflux disease)    HLD (hyperlipidemia)     History reviewed. No pertinent surgical history.  History reviewed. No pertinent family history. Social History:  reports that he has never smoked. He has never used smokeless tobacco. He reports current alcohol use of about 14.0 standard drinks of alcohol per week. He reports current drug use. Frequency: 7.00 times per week. Drug: Marijuana.  Allergies: No Known Allergies  Medications Prior to Admission  Medication Sig Dispense Refill   atorvastatin (LIPITOR) 40 MG tablet Take 40 mg by mouth every evening.     gabapentin (NEURONTIN) 300 MG capsule Take 600 mg by mouth in the morning, at noon, and at bedtime.     metFORMIN (GLUCOPHAGE) 500 MG tablet Take 500 mg by mouth in the morning and at bedtime.     omeprazole (PRILOSEC OTC) 20 MG tablet Take 20 mg by mouth daily as needed.      Results for orders placed or performed during the hospital encounter of 06/18/22 (from the past 48 hour(s))  Glucose, capillary     Status: Abnormal   Collection Time: 06/18/22  9:05 AM  Result Value Ref Range   Glucose-Capillary 154 (H) 70 - 99 mg/dL    Comment: Glucose reference range applies only to samples taken after fasting for at least 8 hours.   No results found.  Review of Systems  All other systems reviewed and are negative.   Blood pressure 116/86, pulse 65, temperature 97.6 F (36.4 C), temperature source Oral, resp. rate 18, height 6\' 4"  (1.93 m), weight 90.7 kg, SpO2 97 %. Physical Exam Constitutional:      Appearance: Normal  appearance. He is normal weight.  HENT:     Head: Normocephalic and atraumatic.     Right Ear: External ear normal.     Left Ear: External ear normal.     Nose: Nose normal.     Mouth/Throat:     Mouth: Mucous membranes are moist.     Pharynx: Oropharynx is clear.  Eyes:     Extraocular Movements: Extraocular movements intact.     Conjunctiva/sclera: Conjunctivae normal.     Pupils: Pupils are equal, round, and reactive to light.  Cardiovascular:     Rate and Rhythm: Normal rate.  Pulmonary:     Effort: Pulmonary effort is normal.  Musculoskeletal:     Cervical back: Normal range of motion.  Skin:    General: Skin is warm and dry.  Neurological:     General: No focal deficit present.     Mental Status: He is alert and oriented to person, place, and time.  Psychiatric:        Mood and Affect: Mood normal.        Behavior: Behavior normal.        Thought Content: Thought content normal.        Judgment: Judgment normal.      Assessment/Plan Sleep apnea  To OR for sleep endoscopy.  , MD 06/18/2022, 9:20 AM

## 2022-06-18 NOTE — Anesthesia Postprocedure Evaluation (Signed)
Anesthesia Post Note  Patient: Marcus Sanders  Procedure(s) Performed: DRUG INDUCED ENDOSCOPY (Bilateral)     Patient location during evaluation: PACU Anesthesia Type: MAC Level of consciousness: awake and alert Pain management: pain level controlled Vital Signs Assessment: post-procedure vital signs reviewed and stable Respiratory status: spontaneous breathing, nonlabored ventilation and respiratory function stable Cardiovascular status: blood pressure returned to baseline and stable Postop Assessment: no apparent nausea or vomiting Anesthetic complications: no   No notable events documented.  Last Vitals:  Vitals:   06/18/22 1015 06/18/22 1030  BP: 98/71 113/76  Pulse: 62 (!) 55  Resp: 11 13  Temp: 36.4 C 36.4 C  SpO2: 92% 96%    Last Pain:  Vitals:   06/18/22 1030  TempSrc:   PainSc: 0-No pain                 Lidia Collum

## 2022-06-18 NOTE — Op Note (Signed)
Preop diagnosis: Obstructive sleep apnea Postop diagnosis: same Procedure: Drug-induced sleep endoscopy Surgeon: Perlie Stene Anesth: IV sedation Compl: None Findings: There is 100% anterior-posterior collapse at the velum making him a candidate for hypoglossal nerve stimulator placement.  There was also marked anterior-posterior collapse at the tongue base. Description:  After discussing risks, benefits, and alternatives, the patient was brought to the operative suite and placed on the operative table in the supine position.  Anesthesia was induced and the patient was given light sedation to simulate natural sleep. When the proper level was reached, an Afrin-soaked pledget was placed in the right nasal passage for a couple of minutes and then removed.  The fiberoptic laryngoscope was then passed to view the pharynx and larynx.  Findings are noted above and the exam was recorded.  After completion, the scope was removed and the patient was returned to anesthesia for wakeup and was moved to the recovery room in stable condition.  

## 2022-06-18 NOTE — Transfer of Care (Signed)
Immediate Anesthesia Transfer of Care Note  Patient: Marcus Sanders  Procedure(s) Performed: DRUG INDUCED ENDOSCOPY (Bilateral)  Patient Location: PACU  Anesthesia Type:MAC  Level of Consciousness: drowsy and patient cooperative  Airway & Oxygen Therapy: Patient Spontanous Breathing  Post-op Assessment: Report given to RN and Post -op Vital signs reviewed and stable  Post vital signs: Reviewed and stable  Last Vitals:  Vitals Value Taken Time  BP 98/71 06/18/22 1015  Temp    Pulse 61 06/18/22 1015  Resp 13 06/18/22 1015  SpO2 95 % 06/18/22 1015  Vitals shown include unvalidated device data.  Last Pain:  Vitals:   06/18/22 0907  TempSrc:   PainSc: 0-No pain         Complications: No notable events documented.

## 2022-06-19 ENCOUNTER — Encounter (HOSPITAL_COMMUNITY): Payer: Self-pay | Admitting: Otolaryngology

## 2022-06-26 ENCOUNTER — Other Ambulatory Visit: Payer: Self-pay | Admitting: Otolaryngology

## 2022-08-04 ENCOUNTER — Encounter (HOSPITAL_BASED_OUTPATIENT_CLINIC_OR_DEPARTMENT_OTHER)
Admission: RE | Admit: 2022-08-04 | Discharge: 2022-08-04 | Disposition: A | Discharge status: Home / Self Care | Payer: No Typology Code available for payment source | Source: Ambulatory Visit | Attending: Otolaryngology | Admitting: Otolaryngology

## 2022-08-04 ENCOUNTER — Encounter (HOSPITAL_BASED_OUTPATIENT_CLINIC_OR_DEPARTMENT_OTHER): Payer: Self-pay | Admitting: Otolaryngology

## 2022-08-04 ENCOUNTER — Other Ambulatory Visit: Payer: Self-pay

## 2022-08-04 DIAGNOSIS — Z01812 Encounter for preprocedural laboratory examination: Secondary | ICD-10-CM | POA: Insufficient documentation

## 2022-08-04 LAB — BASIC METABOLIC PANEL
Anion gap: 10 (ref 5–15)
BUN: 17 mg/dL (ref 8–23)
CO2: 23 mmol/L (ref 22–32)
Calcium: 9.4 mg/dL (ref 8.9–10.3)
Chloride: 105 mmol/L (ref 98–111)
Creatinine, Ser: 1 mg/dL (ref 0.61–1.24)
GFR, Estimated: >60 mL/min (ref >60)
Glucose, Bld: 247 mg/dL — ABNORMAL HIGH (ref 70–99)
Potassium: 4.1 mmol/L (ref 3.5–5.1)
Sodium: 138 mmol/L (ref 135–145)

## 2022-08-04 NOTE — Progress Notes (Signed)

## 2022-08-13 ENCOUNTER — Ambulatory Visit (HOSPITAL_BASED_OUTPATIENT_CLINIC_OR_DEPARTMENT_OTHER): Payer: No Typology Code available for payment source | Admitting: Certified Registered"

## 2022-08-13 ENCOUNTER — Encounter (HOSPITAL_BASED_OUTPATIENT_CLINIC_OR_DEPARTMENT_OTHER)
Admission: RE | Disposition: A | Discharge status: Home / Self Care | Payer: Self-pay | Source: Home / Self Care | Attending: Otolaryngology

## 2022-08-13 ENCOUNTER — Ambulatory Visit (HOSPITAL_COMMUNITY): Payer: No Typology Code available for payment source

## 2022-08-13 ENCOUNTER — Other Ambulatory Visit: Payer: Self-pay

## 2022-08-13 ENCOUNTER — Encounter (HOSPITAL_BASED_OUTPATIENT_CLINIC_OR_DEPARTMENT_OTHER): Payer: Self-pay | Admitting: Otolaryngology

## 2022-08-13 ENCOUNTER — Ambulatory Visit (HOSPITAL_BASED_OUTPATIENT_CLINIC_OR_DEPARTMENT_OTHER)
Admission: RE | Admit: 2022-08-13 | Discharge: 2022-08-13 | Disposition: A | Discharge status: Home / Self Care | Payer: No Typology Code available for payment source | Attending: Otolaryngology | Admitting: Otolaryngology

## 2022-08-13 DIAGNOSIS — Z7984 Long term (current) use of oral hypoglycemic drugs: Secondary | ICD-10-CM | POA: Diagnosis not present

## 2022-08-13 DIAGNOSIS — K219 Gastro-esophageal reflux disease without esophagitis: Secondary | ICD-10-CM | POA: Insufficient documentation

## 2022-08-13 DIAGNOSIS — Z79899 Other long term (current) drug therapy: Secondary | ICD-10-CM | POA: Diagnosis not present

## 2022-08-13 DIAGNOSIS — F419 Anxiety disorder, unspecified: Secondary | ICD-10-CM | POA: Diagnosis not present

## 2022-08-13 DIAGNOSIS — G4733 Obstructive sleep apnea (adult) (pediatric): Secondary | ICD-10-CM

## 2022-08-13 DIAGNOSIS — R7303 Prediabetes: Secondary | ICD-10-CM | POA: Diagnosis not present

## 2022-08-13 DIAGNOSIS — Z6823 Body mass index (BMI) 23.0-23.9, adult: Secondary | ICD-10-CM | POA: Insufficient documentation

## 2022-08-13 DIAGNOSIS — M1990 Unspecified osteoarthritis, unspecified site: Secondary | ICD-10-CM | POA: Insufficient documentation

## 2022-08-13 DIAGNOSIS — Z01818 Encounter for other preprocedural examination: Secondary | ICD-10-CM

## 2022-08-13 DIAGNOSIS — F909 Attention-deficit hyperactivity disorder, unspecified type: Secondary | ICD-10-CM | POA: Insufficient documentation

## 2022-08-13 HISTORY — PX: IMPLANTATION OF HYPOGLOSSAL NERVE STIMULATOR: SHX6827

## 2022-08-13 LAB — GLUCOSE, CAPILLARY
Glucose-Capillary: 141 mg/dL — ABNORMAL HIGH (ref 70–99)
Glucose-Capillary: 141 mg/dL — ABNORMAL HIGH (ref 70–99)

## 2022-08-13 SURGERY — INSERTION, HYPOGLOSSAL NERVE STIMULATOR
Anesthesia: General | Site: Chest | Laterality: Right

## 2022-08-13 MED ORDER — EPHEDRINE SULFATE (PRESSORS) 50 MG/ML IJ SOLN
INTRAMUSCULAR | Status: DC | PRN
  Administered 2022-08-13: 10 mg via INTRAVENOUS
  Administered 2022-08-13: 5 mg via INTRAVENOUS
  Administered 2022-08-13: 10 mg via INTRAVENOUS

## 2022-08-13 MED ORDER — EPHEDRINE 5 MG/ML INJ
INTRAVENOUS | Status: AC
  Filled 2022-08-13: qty 5

## 2022-08-13 MED ORDER — DEXAMETHASONE SODIUM PHOSPHATE 10 MG/ML IJ SOLN
INTRAMUSCULAR | Status: AC
  Filled 2022-08-13: qty 1

## 2022-08-13 MED ORDER — ONDANSETRON HCL 4 MG/2ML IJ SOLN
INTRAMUSCULAR | Status: AC
  Filled 2022-08-13: qty 2

## 2022-08-13 MED ORDER — ACETAMINOPHEN 10 MG/ML IV SOLN: 1000.0000 mg | Freq: Once | INTRAVENOUS | Status: DC | PRN

## 2022-08-13 MED ORDER — ACETAMINOPHEN 500 MG PO TABS: 1000.0000 mg | ORAL_TABLET | Freq: Once | ORAL | Status: DC

## 2022-08-13 MED ORDER — FENTANYL CITRATE (PF) 100 MCG/2ML IJ SOLN
INTRAMUSCULAR | Status: DC | PRN
  Administered 2022-08-13: 100 ug via INTRAVENOUS
  Administered 2022-08-13: 50 ug via INTRAVENOUS

## 2022-08-13 MED ORDER — KETOROLAC TROMETHAMINE 30 MG/ML IJ SOLN: 30.0000 mg | Freq: Once | INTRAMUSCULAR | Status: DC

## 2022-08-13 MED ORDER — FENTANYL CITRATE (PF) 100 MCG/2ML IJ SOLN
INTRAMUSCULAR | Status: AC
  Filled 2022-08-13: qty 2

## 2022-08-13 MED ORDER — PROPOFOL 10 MG/ML IV BOLUS
INTRAVENOUS | Status: DC | PRN
  Administered 2022-08-13 (×2): 100 mg via INTRAVENOUS

## 2022-08-13 MED ORDER — LIDOCAINE HCL (CARDIAC) PF 100 MG/5ML IV SOSY
PREFILLED_SYRINGE | INTRAVENOUS | Status: DC | PRN
  Administered 2022-08-13: 60 mg via INTRAVENOUS

## 2022-08-13 MED ORDER — FENTANYL CITRATE (PF) 100 MCG/2ML IJ SOLN: 25.0000 ug | INTRAMUSCULAR | Status: DC | PRN

## 2022-08-13 MED ORDER — AMISULPRIDE (ANTIEMETIC) 5 MG/2ML IV SOLN: 10.0000 mg | Freq: Once | INTRAVENOUS | Status: DC | PRN

## 2022-08-13 MED ORDER — MIDAZOLAM HCL 5 MG/5ML IJ SOLN
INTRAMUSCULAR | Status: DC | PRN
  Administered 2022-08-13: 2 mg via INTRAVENOUS

## 2022-08-13 MED ORDER — SODIUM CHLORIDE (PF) 0.9 % IJ SOLN
INTRAMUSCULAR | Status: AC
  Filled 2022-08-13: qty 20

## 2022-08-13 MED ORDER — PROMETHAZINE HCL 25 MG/ML IJ SOLN: 6.2500 mg | INTRAMUSCULAR | Status: DC | PRN

## 2022-08-13 MED ORDER — OXYCODONE HCL 5 MG/5ML PO SOLN: 5.0000 mg | Freq: Once | ORAL | Status: DC | PRN

## 2022-08-13 MED ORDER — LACTATED RINGERS IV SOLN: INTRAVENOUS | Status: DC

## 2022-08-13 MED ORDER — CEFAZOLIN SODIUM-DEXTROSE 2-4 GM/100ML-% IV SOLN
2.0000 g | INTRAVENOUS | Status: AC
  Administered 2022-08-13: 2 g via INTRAVENOUS

## 2022-08-13 MED ORDER — CEFAZOLIN SODIUM-DEXTROSE 2-4 GM/100ML-% IV SOLN
INTRAVENOUS | Status: AC
  Filled 2022-08-13: qty 100

## 2022-08-13 MED ORDER — 0.9 % SODIUM CHLORIDE (POUR BTL) OPTIME
TOPICAL | Status: DC | PRN
  Administered 2022-08-13: 200 mL

## 2022-08-13 MED ORDER — HYDROCODONE-ACETAMINOPHEN 5-325 MG PO TABS: 1.0000 | ORAL_TABLET | Freq: Four times a day (QID) | ORAL | 0 refills | Status: DC | PRN

## 2022-08-13 MED ORDER — LIDOCAINE-EPINEPHRINE 1 %-1:100000 IJ SOLN
INTRAMUSCULAR | Status: DC | PRN
  Administered 2022-08-13: 5 mL

## 2022-08-13 MED ORDER — MIDAZOLAM HCL 2 MG/2ML IJ SOLN
INTRAMUSCULAR | Status: AC
  Filled 2022-08-13: qty 2

## 2022-08-13 MED ORDER — SUCCINYLCHOLINE CHLORIDE 200 MG/10ML IV SOSY
PREFILLED_SYRINGE | INTRAVENOUS | Status: DC | PRN
  Administered 2022-08-13: 120 mg via INTRAVENOUS

## 2022-08-13 MED ORDER — ONDANSETRON HCL 4 MG/2ML IJ SOLN
INTRAMUSCULAR | Status: DC | PRN
  Administered 2022-08-13: 4 mg via INTRAVENOUS

## 2022-08-13 MED ORDER — PHENYLEPHRINE HCL (PRESSORS) 10 MG/ML IV SOLN
INTRAVENOUS | Status: AC
  Filled 2022-08-13: qty 1

## 2022-08-13 MED ORDER — DEXAMETHASONE SODIUM PHOSPHATE 4 MG/ML IJ SOLN
INTRAMUSCULAR | Status: DC | PRN
  Administered 2022-08-13: 4 mg via INTRAVENOUS

## 2022-08-13 MED ORDER — PHENYLEPHRINE HCL-NACL 20-0.9 MG/250ML-% IV SOLN
INTRAVENOUS | Status: DC | PRN
  Administered 2022-08-13: 25 ug/min via INTRAVENOUS

## 2022-08-13 MED ORDER — OXYCODONE HCL 5 MG PO TABS: 5.0000 mg | ORAL_TABLET | Freq: Once | ORAL | Status: DC | PRN

## 2022-08-13 SURGICAL SUPPLY — 67 items
ACC NRSTM 4 TRQ WRNCH STRL (MISCELLANEOUS)
ADH SKN CLS APL DERMABOND .7 (GAUZE/BANDAGES/DRESSINGS) ×2
BLADE CLIPPER SURG (BLADE) IMPLANT
BLADE SURG 15 STRL LF DISP TIS (BLADE) ×1 IMPLANT
BLADE SURG 15 STRL SS (BLADE) ×1
CANISTER SUCT 1200ML W/VALVE (MISCELLANEOUS) ×1 IMPLANT
CORD BIPOLAR FORCEPS 12FT (ELECTRODE) ×1 IMPLANT
COVER PROBE W GEL 5X96 (DRAPES) ×1 IMPLANT
DERMABOND ADVANCED .7 DNX12 (GAUZE/BANDAGES/DRESSINGS) ×2 IMPLANT
DRAPE C-ARM 35X43 STRL (DRAPES) ×1 IMPLANT
DRAPE HEAD BAR (DRAPES) IMPLANT
DRAPE INCISE IOBAN 66X45 STRL (DRAPES) ×1 IMPLANT
DRAPE MICROSCOPE WILD 40.5X102 (DRAPES) ×1 IMPLANT
DRAPE UTILITY XL STRL (DRAPES) ×1 IMPLANT
DRSG TEGADERM 2-3/8X2-3/4 SM (GAUZE/BANDAGES/DRESSINGS) ×2 IMPLANT
DRSG TEGADERM 4X4.75 (GAUZE/BANDAGES/DRESSINGS) IMPLANT
ELECT COATED BLADE 2.86 ST (ELECTRODE) ×1 IMPLANT
ELECT EMG 18 NIMS (NEUROSURGERY SUPPLIES) ×1
ELECT REM PT RETURN 9FT ADLT (ELECTROSURGICAL) ×1
ELECTRODE EMG 18 NIMS (NEUROSURGERY SUPPLIES) ×1 IMPLANT
ELECTRODE REM PT RTRN 9FT ADLT (ELECTROSURGICAL) ×1 IMPLANT
FORCEPS BIPOLAR SPETZLER 8 1.0 (NEUROSURGERY SUPPLIES) ×1 IMPLANT
GAUZE 4X4 16PLY ~~LOC~~+RFID DBL (SPONGE) ×1 IMPLANT
GAUZE SPONGE 4X4 12PLY STRL (GAUZE/BANDAGES/DRESSINGS) ×1 IMPLANT
GENERATOR PULSE INSPIRE (Generator) ×1 IMPLANT
GENERATOR PULSE INSPIRE IV (Generator) ×1 IMPLANT
GLOVE BIO SURGEON STRL SZ 6.5 (GLOVE) IMPLANT
GLOVE BIO SURGEON STRL SZ7.5 (GLOVE) ×1 IMPLANT
GLOVE BIOGEL PI IND STRL 7.0 (GLOVE) IMPLANT
GLOVE BIOGEL PI IND STRL 7.5 (GLOVE) IMPLANT
GLOVE ECLIPSE 6.5 STRL STRAW (GLOVE) IMPLANT
GOWN STRL REUS W/ TWL LRG LVL3 (GOWN DISPOSABLE) ×3 IMPLANT
GOWN STRL REUS W/TWL LRG LVL3 (GOWN DISPOSABLE) ×3
IV CATH 18G SAFETY (IV SOLUTION) ×1 IMPLANT
KIT NEURO ACCESSORY W/WRENCH (MISCELLANEOUS) IMPLANT
LEAD SENSING RESP INSPIRE (Lead) ×1 IMPLANT
LEAD SENSING RESP INSPIRE IV (Lead) ×1 IMPLANT
LEAD SLEEP STIM INSPIRE IV/V (Lead) ×1 IMPLANT
LEAD SLEEP STIMULATION INSPIRE (Lead) ×1 IMPLANT
LOOP VESSEL MAXI BLUE (MISCELLANEOUS) ×1 IMPLANT
LOOP VESSEL MINI RED (MISCELLANEOUS) ×1 IMPLANT
MARKER SKIN DUAL TIP RULER LAB (MISCELLANEOUS) ×1 IMPLANT
NDL HYPO 25X1 1.5 SAFETY (NEEDLE) ×1 IMPLANT
NEEDLE HYPO 25X1 1.5 SAFETY (NEEDLE) ×1 IMPLANT
NS IRRIG 1000ML POUR BTL (IV SOLUTION) ×1 IMPLANT
PACK BASIN DAY SURGERY FS (CUSTOM PROCEDURE TRAY) ×1 IMPLANT
PACK ENT DAY SURGERY (CUSTOM PROCEDURE TRAY) ×1 IMPLANT
PASSER CATH 36 CODMAN DISP (NEUROSURGERY SUPPLIES) IMPLANT
PASSER CATH 38CM DISP (INSTRUMENTS) IMPLANT
PENCIL SMOKE EVACUATOR (MISCELLANEOUS) ×1 IMPLANT
PROBE NERVE STIMULATOR (NEUROSURGERY SUPPLIES) ×1 IMPLANT
REMOTE CONTROL SLEEP INSPIRE (MISCELLANEOUS) ×1 IMPLANT
SET WALTER ACTIVATION W/DRAPE (SET/KITS/TRAYS/PACK) ×1 IMPLANT
SLEEVE SCD COMPRESS KNEE MED (STOCKING) ×1 IMPLANT
SPONGE INTESTINAL PEANUT (DISPOSABLE) ×1 IMPLANT
SUT SILK 2 0 SH (SUTURE) ×1 IMPLANT
SUT SILK 3 0 REEL (SUTURE) ×1 IMPLANT
SUT SILK 3 0 SH 30 (SUTURE) ×1 IMPLANT
SUT SILK 3-0 (SUTURE) ×2
SUT SILK 3-0 RB1 30XBRD (SUTURE) ×2
SUT VIC AB 3-0 SH 27 (SUTURE) ×1
SUT VIC AB 3-0 SH 27X BRD (SUTURE) ×1 IMPLANT
SUT VIC AB 4-0 PS2 27 (SUTURE) ×1 IMPLANT
SUTURE SILK 3-0 RB1 30XBRD (SUTURE) ×2 IMPLANT
SYR 10ML LL (SYRINGE) ×1 IMPLANT
SYR BULB EAR ULCER 3OZ GRN STR (SYRINGE) ×1 IMPLANT
TOWEL GREEN STERILE FF (TOWEL DISPOSABLE) ×2 IMPLANT

## 2022-08-13 NOTE — H&P (Signed)
Marcus Sanders is an 61 y.o. male.   Chief Complaint: Sleep apnea HPI: 61 year old male with obstructive sleep apnea who has been unable to tolerate CPAP.  Past Medical History:  Diagnosis Date   ADHD (attention deficit hyperactivity disorder)    Arthritis    Complication of anesthesia    if not under deep enough , I start snorning   Depression    Diabetes mellitus without complication (HCC)    GERD (gastroesophageal reflux disease)    HLD (hyperlipidemia)     Past Surgical History:  Procedure Laterality Date   DRUG INDUCED ENDOSCOPY Bilateral 06/18/2022   Procedure: DRUG INDUCED ENDOSCOPY;  Surgeon: Melida Quitter, MD;  Location: [facility];  Service: ENT;  Laterality: Bilateral;    History reviewed. No pertinent family history. Social History:  reports that he has never smoked. He has never used smokeless tobacco. He reports current alcohol use of about 14.0 standard drinks of alcohol per week. He reports current drug use. Frequency: 7.00 times per week. Drug: Marijuana.  Allergies: No Known Allergies  Medications Prior to Admission  Medication Sig Dispense Refill   atorvastatin (LIPITOR) 40 MG tablet Take 40 mg by mouth every evening.     gabapentin (NEURONTIN) 300 MG capsule Take 600 mg by mouth in the morning, at noon, and at bedtime.     metFORMIN (GLUCOPHAGE) 500 MG tablet Take 500 mg by mouth in the morning and at bedtime.     omeprazole (PRILOSEC OTC) 20 MG tablet Take 20 mg by mouth daily as needed.      Results for orders placed or performed during the hospital encounter of 08/13/22 (from the past 48 hour(s))  Glucose, capillary     Status: Abnormal   Collection Time: 08/13/22  9:16 AM  Result Value Ref Range   Glucose-Capillary 141 (H) 70 - 99 mg/dL    Comment: Glucose reference range applies only to samples taken after fasting for at least 8 hours.   No results found.  Review of Systems  All other systems reviewed and are negative.   Blood pressure 122/87, pulse  76, temperature 97.6 F (36.4 C), temperature source Oral, resp. rate 16, height 6\' 4"  (1.93 m), weight 88.7 kg, SpO2 98 %. Physical Exam Constitutional:      Appearance: Normal appearance. He is normal weight.  HENT:     Head: Normocephalic and atraumatic.     Right Ear: External ear normal.     Left Ear: External ear normal.     Nose: Nose normal.     Mouth/Throat:     Mouth: Mucous membranes are moist.  Eyes:     Extraocular Movements: Extraocular movements intact.     Conjunctiva/sclera: Conjunctivae normal.     Pupils: Pupils are equal, round, and reactive to light.  Cardiovascular:     Rate and Rhythm: Normal rate.  Pulmonary:     Effort: Pulmonary effort is normal.  Musculoskeletal:     Cervical back: Normal range of motion.  Skin:    General: Skin is warm and dry.  Neurological:     General: No focal deficit present.     Mental Status: He is alert and oriented to person, place, and time.  Psychiatric:        Mood and Affect: Mood normal.        Behavior: Behavior normal.        Thought Content: Thought content normal.        Judgment: Judgment normal.  Assessment/Plan Obstructive sleep apnea and BMI 23.80  To OR for hypoglossal nerve stimulator placement.  Melida Quitter, MD 08/13/2022, 2:11 PM

## 2022-08-13 NOTE — Op Note (Signed)

## 2022-08-13 NOTE — Anesthesia Procedure Notes (Signed)
Procedure Name: Intubation Date/Time: 08/13/2022 2:30 PM  Performed by: Lavonia Dana, CRNAPre-anesthesia Checklist: Patient identified, Emergency Drugs available, Suction available and Patient being monitored Patient Re-evaluated:Patient Re-evaluated prior to induction Oxygen Delivery Method: Circle system utilized Preoxygenation: Pre-oxygenation with 100% oxygen Induction Type: IV induction Ventilation: Mask ventilation without difficulty and Oral airway inserted - appropriate to patient size Laryngoscope Size: Glidescope and 4 Grade View: Grade I Tube type: Oral Tube size: 7.5 mm Number of attempts: 2 Airway Equipment and Method: Stylet, Oral airway and Bite block Placement Confirmation: ETT inserted through vocal cords under direct vision, positive ETCO2 and breath sounds checked- equal and bilateral Secured at: 24 cm Tube secured with: Tape Dental Injury: Teeth and Oropharynx as per pre-operative assessment  Comments: DL with Mac 3 and Mac 4 with grade III view; decision made to switch to Glidescope-grade I view, ETT passed easily through cords, placement verified with equal BBS and +ETCO2.

## 2022-08-13 NOTE — Brief Op Note (Signed)
08/13/2022  4:23 PM  PATIENT:  Marcus Sanders  61 y.o. male  PRE-OPERATIVE DIAGNOSIS:  Obstructive Sleep Apnea BMI 23.0-23.9, adult  POST-OPERATIVE DIAGNOSIS:  Obstructive Sleep Apnea  PROCEDURE:  Procedure(s): IMPLANTATION OF HYPOGLOSSAL NERVE STIMULATOR (Right)  SURGEON:  Surgeon(s) and Role:    Melida Quitter, MD - Primary  PHYSICIAN ASSISTANT:   ASSISTANTS: none   ANESTHESIA:   general  EBL:  15 mL   BLOOD ADMINISTERED:none  DRAINS: none   LOCAL MEDICATIONS USED:  LIDOCAINE   SPECIMEN:  No Specimen  DISPOSITION OF SPECIMEN:  N/A  COUNTS:  YES  TOURNIQUET:  * No tourniquets in log *  DICTATION: .Note written in EPIC  PLAN OF CARE: Discharge to home after PACU  PATIENT DISPOSITION:  PACU - hemodynamically stable.   Delay start of Pharmacological VTE agent (>24hrs) due to surgical blood loss or risk of bleeding: no

## 2022-08-13 NOTE — Discharge Instructions (Signed)

## 2022-08-13 NOTE — Anesthesia Preprocedure Evaluation (Addendum)
Anesthesia Evaluation  Patient identified by MRN, date of birth, ID band Patient awake    Reviewed: Allergy & Precautions, NPO status , Patient's Chart, lab work & pertinent test results  Airway Mallampati: I  TM Distance: >3 FB Neck ROM: Full    Dental  (+) Edentulous Upper, Edentulous Lower   Pulmonary neg pulmonary ROS,    Pulmonary exam normal        Cardiovascular negative cardio ROS Normal cardiovascular exam     Neuro/Psych PSYCHIATRIC DISORDERS Depression negative neurological ROS     GI/Hepatic GERD  Medicated and Controlled,(+)     substance abuse  ,   Endo/Other  diabetes (PRE), Oral Hypoglycemic Agents  Renal/GU negative Renal ROS     Musculoskeletal  (+) Arthritis ,   Abdominal   Peds  (+) ADHD Hematology negative hematology ROS (+)   Anesthesia Other Findings Obstructive Sleep Apnea    Reproductive/Obstetrics                            Anesthesia Physical Anesthesia Plan  ASA: 2  Anesthesia Plan: General   Post-op Pain Management:    Induction: Intravenous  PONV Risk Score and Plan: 3 and Ondansetron, Midazolam, Dexamethasone and Treatment may vary due to age or medical condition  Airway Management Planned: Oral ETT  Additional Equipment:   Intra-op Plan:   Post-operative Plan: Extubation in OR  Informed Consent: I have reviewed the patients History and Physical, chart, labs and discussed the procedure including the risks, benefits and alternatives for the proposed anesthesia with the patient or authorized representative who has indicated his/her understanding and acceptance.       Plan Discussed with: CRNA  Anesthesia Plan Comments:        Anesthesia Quick Evaluation

## 2022-08-13 NOTE — Transfer of Care (Signed)
Immediate Anesthesia Transfer of Care Note  Patient: Marcus Sanders  Procedure(s) Performed: IMPLANTATION OF HYPOGLOSSAL NERVE STIMULATOR (Right: Chest)  Patient Location: PACU  Anesthesia Type:General  Level of Consciousness: awake, alert  and oriented  Airway & Oxygen Therapy: Patient Spontanous Breathing and Patient connected to face mask oxygen  Post-op Assessment: Report given to RN and Post -op Vital signs reviewed and stable  Post vital signs: Reviewed and stable  Last Vitals:  Vitals Value Taken Time  BP 141/90 08/13/22 1624  Temp    Pulse 78 08/13/22 1627  Resp 13 08/13/22 1627  SpO2 98 % 08/13/22 1627  Vitals shown include unvalidated device data.  Last Pain:  Vitals:   08/13/22 0917  TempSrc: Oral  PainSc: 0-No pain      Patients Stated Pain Goal: 3 (63/33/54 5625)  Complications: No notable events documented.

## 2022-08-13 NOTE — Anesthesia Postprocedure Evaluation (Signed)
Anesthesia Post Note  Patient: Marcus Sanders  Procedure(s) Performed: IMPLANTATION OF HYPOGLOSSAL NERVE STIMULATOR (Right: Chest)     Patient location during evaluation: PACU Anesthesia Type: General Level of consciousness: awake Pain management: pain level controlled Vital Signs Assessment: post-procedure vital signs reviewed and stable Respiratory status: spontaneous breathing, nonlabored ventilation, respiratory function stable and patient connected to nasal cannula oxygen Cardiovascular status: blood pressure returned to baseline and stable Postop Assessment: no apparent nausea or vomiting Anesthetic complications: no   No notable events documented.  Last Vitals:  Vitals:   08/13/22 1700 08/13/22 1724  BP: 120/89 132/83  Pulse: 75 74  Resp: 13 14  Temp:  36.8 C  SpO2: 95% 96%    Last Pain:  Vitals:   08/13/22 1724  TempSrc:   PainSc: 3                  Debera Sterba P Ayyan Sites

## 2022-08-14 ENCOUNTER — Encounter (HOSPITAL_BASED_OUTPATIENT_CLINIC_OR_DEPARTMENT_OTHER): Payer: Self-pay | Admitting: Otolaryngology

## 2022-09-16 ENCOUNTER — Institutional Professional Consult (permissible substitution): Payer: Self-pay | Admitting: Pulmonary Disease

## 2022-10-02 ENCOUNTER — Ambulatory Visit (INDEPENDENT_AMBULATORY_CARE_PROVIDER_SITE_OTHER): Payer: Self-pay | Admitting: Pulmonary Disease

## 2022-10-02 ENCOUNTER — Encounter: Payer: Self-pay | Admitting: Pulmonary Disease

## 2022-10-02 VITALS — BP 106/64 | HR 69 | Temp 98.3°F | Ht 76.0 in | Wt 207.0 lb

## 2022-10-02 DIAGNOSIS — G4733 Obstructive sleep apnea (adult) (pediatric): Secondary | ICD-10-CM

## 2022-10-02 NOTE — Progress Notes (Addendum)
Marcus Sanders    [id number]    [birth year]  Primary Care Physician:[facility]  Referring Physician: [facility] [address] [place],  [place] [address]  Chief complaint:   Patient with a history of obstructive sleep apnea, intolerant of CPAP Had inspire device implantation recently In for activation today  HPI:  Has had problems with his sleep for many years 20 years with concerns for sleep disordered breathing Did have a study done about a year and a half ago was started on CPAP therapy which she could not tolerate  Usually goes to bed between 8 and 9 PM Takes him about 10 minutes to fall asleep 3-4 awakenings Final wake up time between 5 and 6 AM  Weight is stable, may be up about 10 to 15 pounds  Wakes up feeling like he is at a good nights rest  Has a history of prediabetes which is fairly well-controlled at present  Does not smoke, 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   gabapentin (NEURONTIN) 300 MG capsule Take 600 mg by mouth in the morning, at noon, and at bedtime.   HYDROcodone-acetaminophen (NORCO) 5-325 MG tablet Take 1-2 tablets by mouth every 6 (six) hours as needed for moderate pain.   metFORMIN (GLUCOPHAGE) 500 MG tablet Take 500 mg by mouth in the morning and at bedtime.   omeprazole (PRILOSEC OTC) 20 MG tablet Take 20 mg by mouth daily as needed.   No facility-administered encounter medications on file as of 10/02/2022.    Allergies as of 10/02/2022   (No Known Allergies)    Past Medical History:  Diagnosis Date   ADHD (attention deficit hyperactivity disorder)    Arthritis    Complication of anesthesia    if not under deep enough , I start snorning   Depression    Diabetes mellitus without  complication (HCC)    GERD (gastroesophageal reflux disease)    HLD (hyperlipidemia)     Past Surgical History:  Procedure Laterality Date   DRUG INDUCED ENDOSCOPY Bilateral 06/18/2022   Procedure: DRUG INDUCED ENDOSCOPY;  Surgeon: Marcus Reading, MD;  Location: [facility] OR;  Service: ENT;  Laterality: Bilateral;   IMPLANTATION OF HYPOGLOSSAL NERVE STIMULATOR Right 08/13/2022   Procedure: IMPLANTATION OF HYPOGLOSSAL NERVE STIMULATOR;  Surgeon: Marcus Reading, MD;  Location: [facility];  Service: ENT;  Laterality: Right;    No family history on file.  Social History   Socioeconomic History   Marital status: Widowed    Spouse name: Not on file   Number of children: Not on file   Years of education: Not on file   Highest education level: Not on file  Occupational History   Not on file  Tobacco Use   Smoking status: Never   Smokeless tobacco: Never  Vaping Use   Vaping Use: Never used  Substance and Sexual Activity   Alcohol use: Yes    Alcohol/week: 14.0 standard drinks of alcohol    Types: 3 Cans of beer, 11 Standard drinks or equivalent per week    Comment: drinks a rum and Coke some days   Drug use: Yes    Frequency: 7.0 times per week    Types: Marijuana    Comment: daily  Sexual activity: Not on file  Other Topics Concern   Not on file  Social History Narrative   Not on file   Social Determinants of Health   Financial Resource Strain: Not on file  Food Insecurity: Not on file  Transportation Needs: Not on file  Physical Activity: Not on file  Stress: Not on file  Social Connections: Not on file  Intimate Partner Violence: Not on file    Review of Systems  Constitutional:  Negative for fatigue.  Respiratory:  Positive for apnea.   Psychiatric/Behavioral:  Positive for sleep disturbance.     Vitals:   10/02/22 0932  BP: 106/64  Pulse: 69  Temp: 98.3 F (36.8 C)  SpO2: 94%     Physical Exam Constitutional:      Appearance: He is obese.   HENT:     Head: Normocephalic.     Mouth/Throat:     Mouth: Mucous membranes are moist.  Cardiovascular:     Rate and Rhythm: Normal rate and regular rhythm.     Heart sounds: No murmur heard.    No friction rub.  Pulmonary:     Effort: No respiratory distress.     Breath sounds: No stridor. No wheezing or rhonchi.  Musculoskeletal:     Cervical back: No rigidity or tenderness.  Neurological:     Mental Status: He is alert.  Psychiatric:        Mood and Affect: Mood normal.       No data to display         Epworth Sleepiness Scale of 10  Data Reviewed: Records by Dr. Jenne Sanders reviewed  Assessment:  Patient with moderate obstructive sleep apnea intolerant of CPAP therapy Post inspire device implantation  In for device activation today    Plan/Recommendations: Device was activated today  Voltage setting of 1.3 Delay of 30 minutes Pause of 15 min Duration of 8 hours  We will gradually increase the voltage settings as tolerated  Follow-up in 1 month     Marcus Diamond MD [facility] 10/02/2022, 11:57 AM  CC: [facility]

## 2022-11-07 ENCOUNTER — Encounter: Payer: Self-pay | Admitting: Pulmonary Disease

## 2022-11-07 ENCOUNTER — Ambulatory Visit (INDEPENDENT_AMBULATORY_CARE_PROVIDER_SITE_OTHER): Payer: Non-veteran care | Admitting: Pulmonary Disease

## 2022-11-07 ENCOUNTER — Ambulatory Visit: Payer: Non-veteran care | Admitting: Pulmonary Disease

## 2022-11-07 VITALS — BP 120/82 | HR 69 | Ht 76.0 in | Wt 201.0 lb

## 2022-11-07 DIAGNOSIS — G4733 Obstructive sleep apnea (adult) (pediatric): Secondary | ICD-10-CM | POA: Diagnosis not present

## 2022-11-07 DIAGNOSIS — Z9682 Presence of neurostimulator: Secondary | ICD-10-CM

## 2022-11-07 NOTE — Progress Notes (Signed)
Marcus Sanders    [id number]    12/04/1960  Primary Care Physician:[facility]  Referring Physician: [facility] [address] [place],  [place] [address]  Chief complaint:   Patient with a history of obstructive sleep apnea, intolerant of CPAP Had inspire device implantation recently Activated about 4 weeks ago  HPI:  Has had problems with his sleep for many years 20 years with concerns for sleep disordered breathing Did have a study done about a year and a half ago was started on CPAP therapy which she could not tolerate  Had inspire device implantation Activation about 4 weeks ago Has been tolerating advancing his treatment Sleeping well Current voltage setting of 1.6, advance to 1.7 today  Sleep quality remains about the same, energy levels remain about the same Not having any difficulty tolerating increasing voltages  Usually goes to bed between 8 and 9, final wake up time between 5 and 6 AM  Weight is stable, may be up about 10 to 15 pounds  Has a history of prediabetes which is fairly well-controlled at present  Does not smoke, 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   gabapentin (NEURONTIN) 300 MG capsule Take 600 mg by mouth in the morning, at noon, and at bedtime.   metFORMIN (GLUCOPHAGE) 500 MG tablet Take 500 mg by mouth in the morning and at bedtime.   omeprazole (PRILOSEC OTC) 20 MG tablet Take 20 mg by mouth daily as needed.   HYDROcodone-acetaminophen (NORCO) 5-325 MG tablet Take 1-2 tablets by mouth every 6 (six) hours as needed for moderate pain. (Patient not taking: Reported on 11/07/2022)   No facility-administered encounter medications on file as of 11/07/2022.     Allergies as of 11/07/2022   (No Known Allergies)    Past Medical History:  Diagnosis Date   ADHD (attention deficit hyperactivity disorder)    Arthritis    Complication of anesthesia    if not under deep enough , I start snorning   Depression    Diabetes mellitus without complication (HCC)    GERD (gastroesophageal reflux disease)    HLD (hyperlipidemia)     Past Surgical History:  Procedure Laterality Date   DRUG INDUCED ENDOSCOPY Bilateral 06/18/2022   Procedure: DRUG INDUCED ENDOSCOPY;  Surgeon: Melida Quitter, MD;  Location: [facility];  Service: ENT;  Laterality: Bilateral;   IMPLANTATION OF HYPOGLOSSAL NERVE STIMULATOR Right 08/13/2022   Procedure: IMPLANTATION OF HYPOGLOSSAL NERVE STIMULATOR;  Surgeon: Melida Quitter, MD;  Location: [facility];  Service: ENT;  Laterality: Right;    No family history on file.  Social History   Socioeconomic History   Marital status: Widowed    Spouse name: Not on file   Number of children: Not on file   Years of education: Not on file   Highest education level: Not on file  Occupational History   Not on file  Tobacco Use   Smoking status: Never   Smokeless tobacco: Never  Vaping Use   Vaping Use: Never used  Substance and Sexual Activity   Alcohol use: Yes    Alcohol/week: 14.0 standard drinks of alcohol    Types: 3 Cans of beer, 11 Standard drinks or equivalent  per week    Comment: drinks a rum and Coke some days   Drug use: Yes    Frequency: 7.0 times per week    Types: Marijuana    Comment: daily   Sexual activity: Not on file  Other Topics Concern   Not on file  Social History Narrative   Not on file   Social Determinants of Health   Financial Resource Strain: Not on file  Food Insecurity: Not on file  Transportation Needs: Not on file  Physical Activity: Not on file  Stress: Not on file  Social Connections: Not on file  Intimate Partner Violence: Not on file    Review of Systems   Constitutional:  Negative for fatigue.  Respiratory:  Positive for apnea.   Psychiatric/Behavioral:  Positive for sleep disturbance.     Vitals:   11/07/22 1545  BP: 120/82  Pulse: 69  SpO2: 98%     Physical Exam Constitutional:      Appearance: Normal appearance.  HENT:     Head: Normocephalic.     Mouth/Throat:     Mouth: Mucous membranes are moist.  Eyes:     General: No scleral icterus. Cardiovascular:     Rate and Rhythm: Normal rate and regular rhythm.     Heart sounds: No murmur heard.    No friction rub.  Pulmonary:     Effort: No respiratory distress.     Breath sounds: No stridor. No wheezing or rhonchi.  Musculoskeletal:     Cervical back: No rigidity or tenderness.  Neurological:     Mental Status: He is alert.  Psychiatric:        Mood and Affect: Mood normal.    Data Reviewed: Records by Dr. Redmond Baseman reviewed  Inspire device compliance shows nightly use Current voltage setting of 1.6, delay of 30 minutes, pause of 15 minutes which he does not usually use Therapy duration of 8 hours  Assessment:  Patient with moderate obstructive sleep apnea intolerant of CPAP therapy Post inspire device implantation Inspire activation 10/02/2022 -Tolerating device well at present -Appears to be tolerating escalation of voltage well  Encouraged to continue up titration of voltages He is aware to back off if there is any sign of discomfort  Waveform analysis was performed in the office today   Plan/Recommendations: Therapy level was increased to level 5, 1.7 V today Therapy delay of 30 minutes Pause of 15 minutes Therapy duration of 8 hours  I will see him back in about 4 weeks  Will have a sleep study scheduled in about 6 to 8 weeks  Encouraged to call with significant concerns    Sherrilyn Rist MD [facility] 11/07/2022, 4:29 PM  CC: [facility]

## 2022-11-07 NOTE — Patient Instructions (Signed)
Will see in about 4 to 6 weeks and after that visit we will schedule your sleep study  Continue advancing your treatment on a weekly basis as tolerated, if it gets uncomfortable go back to a lower level  Call us with significant concerns  The device appears to be working well

## 2022-12-08 ENCOUNTER — Ambulatory Visit: Payer: Non-veteran care | Admitting: Pulmonary Disease

## 2022-12-08 ENCOUNTER — Other Ambulatory Visit: Payer: Self-pay | Admitting: Pulmonary Disease

## 2022-12-08 DIAGNOSIS — G4733 Obstructive sleep apnea (adult) (pediatric): Secondary | ICD-10-CM

## 2022-12-24 ENCOUNTER — Ambulatory Visit: Payer: Non-veteran care | Admitting: Pulmonary Disease

## 2022-12-25 ENCOUNTER — Ambulatory Visit (INDEPENDENT_AMBULATORY_CARE_PROVIDER_SITE_OTHER): Payer: Self-pay | Admitting: Pulmonary Disease

## 2022-12-25 ENCOUNTER — Encounter: Payer: Self-pay | Admitting: Pulmonary Disease

## 2022-12-25 VITALS — BP 120/78 | HR 68 | Ht 75.0 in | Wt 199.0 lb

## 2022-12-25 DIAGNOSIS — R0602 Shortness of breath: Secondary | ICD-10-CM

## 2022-12-25 NOTE — Patient Instructions (Addendum)
The most important next step is to get your sleep study scheduled  We need your input with respect to getting this authorized  Ensure that you use your device every night  Try and get an adequate number of hours of sleep  Tentative follow-up in 4 to 6 weeks hopefully we can get your sleep study done in quick order  Call us with significant concerns

## 2022-12-25 NOTE — Progress Notes (Signed)
Marcus Sanders    WH:[id number]    12-03-1960  Primary Care Physician:[facility]  Referring Physician: [place] [address],  [place] [id number]  Chief complaint:   Patient with a history of obstructive sleep apnea, intolerant of CPAP Had inspire device implantation 08/13/22  HPI:  Has had problems with his sleep for many years 17 years with concerns for sleep disordered breathing Did have a study done about a year and a half ago was started on CPAP therapy which she could not tolerate  Had inspire device implantation 08/13/2022  He is sleeping relatively well Has not really noticed significant difference in daytime symptoms  Current voltage setting of about 1.6, voltage setting of 1.7 left him with pain and discomfort so went back to 1.6 Has been tolerating statin well  Sleep quality remains about the same, energy levels remain about the same  Usually goes to bed between 8 and 9, final wake up time between 5 and 6 AM  Weight is stable  Has a history of prediabetes which is fairly well-controlled at present  Does not smoke, occasional marijuana use  HVAC technician  Had inspire device implantation 08/13/2022 Inspire activation 10/02/2022  His sleep study with an AHI of 28 7  Outpatient Encounter Medications as of 12/25/2022  Medication Sig   atorvastatin (LIPITOR) 40 MG tablet Take 40 mg by mouth every evening.   gabapentin (NEURONTIN) 300 MG capsule Take 600 mg by mouth in the morning, at noon, and at bedtime.   HYDROcodone-acetaminophen (NORCO) 5-325 MG tablet Take 1-2 tablets by mouth every 6 (six) hours as needed for moderate pain.   metFORMIN (GLUCOPHAGE) 500 MG tablet Take 500 mg by mouth in the morning and at bedtime.   omeprazole (PRILOSEC OTC) 20 MG tablet Take 20 mg by mouth daily as needed.   No facility-administered encounter medications on file as of 12/25/2022.    Allergies as of 12/25/2022   (No Known Allergies)     Past Medical History:  Diagnosis Date   ADHD (attention deficit hyperactivity disorder)    Arthritis    Complication of anesthesia    if not under deep enough , I start snorning   Depression    Diabetes mellitus without complication (HCC)    GERD (gastroesophageal reflux disease)    HLD (hyperlipidemia)     Past Surgical History:  Procedure Laterality Date   DRUG INDUCED ENDOSCOPY Bilateral 06/18/2022   Procedure: DRUG INDUCED ENDOSCOPY;  Surgeon: Melida Quitter, MD;  Location: [facility];  Service: ENT;  Laterality: Bilateral;   IMPLANTATION OF HYPOGLOSSAL NERVE STIMULATOR Right 08/13/2022   Procedure: IMPLANTATION OF HYPOGLOSSAL NERVE STIMULATOR;  Surgeon: Melida Quitter, MD;  Location: [facility];  Service: ENT;  Laterality: Right;    No family history on file.  Social History   Socioeconomic History   Marital status: Widowed    Spouse name: Not on file   Number of children: Not on file   Years of education: Not on file   Highest education level: Not on file  Occupational History   Not on file  Tobacco Use   Smoking status: Never   Smokeless tobacco: Never  Vaping Use   Vaping Use: Never used  Substance and Sexual Activity   Alcohol use: Yes    Alcohol/week: 14.0 standard drinks of alcohol    Types: 3 Cans of beer, 11 Standard drinks or equivalent per week  Comment: drinks a rum and Coke some days   Drug use: Yes    Frequency: 7.0 times per week    Types: Marijuana    Comment: daily   Sexual activity: Not on file  Other Topics Concern   Not on file  Social History Narrative   Not on file   Social Determinants of Health   Financial Resource Strain: Not on file  Food Insecurity: Not on file  Transportation Needs: Not on file  Physical Activity: Not on file  Stress: Not on file  Social Connections: Not on file  Intimate Partner Violence: Not on file    Review of Systems  Constitutional:  Negative for fatigue.  Respiratory:  Positive for  apnea.   Psychiatric/Behavioral:  Positive for sleep disturbance.     Vitals:   12/25/22 0837  BP: 120/78  Pulse: 68  SpO2: 100%     Physical Exam Constitutional:      Appearance: Normal appearance.  HENT:     Head: Normocephalic.     Mouth/Throat:     Mouth: Mucous membranes are moist.  Eyes:     General: No scleral icterus. Cardiovascular:     Rate and Rhythm: Normal rate and regular rhythm.     Heart sounds: No murmur heard.    No friction rub.  Pulmonary:     Effort: No respiratory distress.     Breath sounds: No stridor. No wheezing or rhonchi.  Musculoskeletal:     Cervical back: No rigidity or tenderness.  Neurological:     Mental Status: He is alert.  Psychiatric:        Mood and Affect: Mood normal.   Data Reviewed: Records by Dr. Redmond Baseman reviewed  Device compliance shows 73% use Current voltage setting of 1.6, delay of 30 minutes, pause of 15 minutes, therapy duration of 9 hours current voltage   With patient having difficulty advancing therapy Adjustments were made during the visit today, ultimately was not able to tolerate a level of 1.7 in the office today -Attempts also made to try different stimulation settings unsuccessfully   Assessment:  Patient with moderate obstructive sleep apnea Inspire device in place -Tolerating device well but not relating any significant changes in symptoms His sleep still feels about the same  Still has some daytime fatigue and tiredness  He had waveform analysis performed in the office today Attempts to increase his voltage was not well-tolerated   Plan/Recommendations:  Encouraged to increase voltage 1.7, to stay here if voltage is tolerated, if not tolerated to go back to a level of 1.6  We will try and set him up for is inspire titration study  Follow-up in about 3 months  Encouraged to call with significant concerns  Offer was made to delay sleep onset, he would rather keep it up 30 minutes at  present Voltage setting of 1.6 as he is leaving the office today, encouraged to increase to 1.7 tonight      Sherrilyn Rist MD [facility] 12/25/2022, 8:39 AM  CC: [place]

## 2022-12-29 ENCOUNTER — Encounter: Payer: Self-pay | Admitting: Pulmonary Disease

## 2023-03-25 ENCOUNTER — Ambulatory Visit (HOSPITAL_BASED_OUTPATIENT_CLINIC_OR_DEPARTMENT_OTHER): Payer: Non-veteran care | Attending: Pulmonary Disease | Admitting: Pulmonary Disease

## 2023-03-25 VITALS — Ht 76.0 in | Wt 190.0 lb

## 2023-03-25 DIAGNOSIS — G4733 Obstructive sleep apnea (adult) (pediatric): Secondary | ICD-10-CM | POA: Insufficient documentation

## 2023-03-31 ENCOUNTER — Telehealth: Payer: Self-pay | Admitting: Pulmonary Disease

## 2023-03-31 NOTE — Telephone Encounter (Signed)
Call patient  Sleep study result  Date of study: 03/25/2023  Impression: Optimal treatment of sleep disordered breathing with a voltage setting of 1.3 V  Recommendation:  Optimal treatment of sleep disordered breathing events at 1.3 V  Follow-up as previously scheduled

## 2023-03-31 NOTE — Telephone Encounter (Signed)
Left VM to have pt for the following:  We need a current (updated) [facility] authorization Request pt upload his device into [facility].  Schedule f/u w/ Dr. Wynona Neat - pt completed his Inspire Titration on 03/25/23.

## 2023-04-09 NOTE — Telephone Encounter (Signed)
Left VM   We need a current (updated) [facility] authorization Request pt upload his device into [facility].  Schedule f/u w/ Dr. Wynona Neat - pt completed his Inspire Titration on 03/25/23.

## 2023-04-17 NOTE — Telephone Encounter (Signed)
Call patient   Sleep study result   Date of study: 03/25/2023   Impression: Optimal treatment of sleep disordered breathing with a voltage setting of 1.3 V   Recommendation:   Optimal treatment of sleep disordered breathing events at 1.3 V   Follow-up as previously scheduled       Spoke with patient regarding sleep study result's. Patient stated he has not been sleeping very well and does need to have a referral from his PCP and will call our office back to schedule a f/u.  Patient's voice was understanding.Nothing else further needed.

## 2023-04-23 DIAGNOSIS — G4733 Obstructive sleep apnea (adult) (pediatric): Secondary | ICD-10-CM

## 2023-04-23 NOTE — Procedures (Signed)
POLYSOMNOGRAPHY  Last, First: Marcus, Sanders MRN: [id number] Gender: Male Age (years): 62 Weight (lbs): 190 DOB: [birth year] BMI: 23 Primary Care: No PCP Epworth Score: 12 Referring: Tomma Lightning MD Technician: Shelah Lewandowsky Interpreting: Tomma Lightning MD Study Type: inspire titration<br> Ordered Study Type: inspire titration Study date: 03/25/2023 Location: [facility] CLINICAL INFORMATION Marcus Sanders is a 62 year old Male and was referred to the sleep center for evaluation of Evaluate Surgical Efficacy. Indications include Daytime Fatigue, Diabetes, Established OSA, Excessive Daytime Sleepiness, Leg jerks/kicks during the night, Snoring.  MEDICATIONS Patient self administered medications include: N/A. Medications administered during study include No sleep medicine administered.  SLEEP STUDY TECHNIQUE The patient underwent an attended overnight polysomnography titration to assess the effects of hypoglossal nerve stimulation therapy. The following variables were monitored: EEG(C4-A1, C3-A2, O1-A2, O2-A1), EOG, submental and leg EMG, ECG, oxyhemoglobin saturation by pulse oximetry, thoracic and abdominal respiratory effort belts, nasal/oral airflow by pressure sensor, body position sensor and snoring sensor. Inspire device amplitude was titrated to eliminate apneas, hypopneas and oxygen desaturation. Hypopneas were scored per AASM definition IB (4% desaturation)  TECHNICIAN COMMENTS Comments added by Technician: NONE Comments added by Scorer: N/A SLEEP ARCHITECTURE The study was initiated at 9:43:00 PM and terminated at 4:37:30 AM. Total recorded time was 414.5 minutes. EEG confirmed total sleep time was 322 minutes yielding a sleep efficiency of 77.7%. Sleep onset after lights out was 7.4 minutes with a REM latency of 72.5 minutes. The patient spent 11.5% of the night in stage N1 sleep, 63.5% in stage N2 sleep, 0.0% in stage N3 and 25% in REM. The Arousal Index was  32.6/hour. RESPIRATORY PARAMETERS The overall AHI was 7.5 per hour, and the RDI was 18.1 events/hour with a central apnea index of 2.8per hour. The most appropriate setting of inspire device amplitude at 1.3 V. At this setting, the sleep efficiency was 85% and the patient was supine for 6%. The AHI was 7.3 events per hour, and the RDI was 12.6 events/hour (with 2.8 central events) and the arousal index was 15.1 per hour.The oxygen nadir was 90.0% during sleep.  LEG MOVEMENT DATA The total leg movements were 162 with a resulting leg movement index of 30.2/hr. Associated arousal with leg movement index was 1.7/hr. CARDIAC DATA The underlying cardiac rhythm was most consistent with sinus rhythm. Mean heart rate during sleep was 69.3 bpm. Additional rhythm abnormalities include PVCs.  IMPRESSIONS - Electrocardiographic data showed presence of PVCs. - The patient snored with moderate snoring volume. - No significant Oxygen Desaturation - Mild Obstructive Sleep apnea(OSA) . - No Significant Central Sleep Apnea (CSA) - No significant periodic leg movements(PLMs) during sleep. However, no significant associated arousals. - Reduced sleep efficiency, normal primary sleep latency, short REM sleep latency and long slow wave latency.  DIAGNOSIS - Obstructive Sleep Apnea (G47.33)  RECOMMENDATIONS - Inspire device amplitude of 1.3V was optimal at treating sleep disordered breathing events. - Avoid alcohol, sedatives and other CNS depressants that may worsen sleep apnea and disrupt normal sleep architecture. - Sleep hygiene should be reviewed to assess factors that may improve sleep quality. - Weight management and regular exercise should be initiated or continued. - Return to [facility] for re-evaluation after 4 weeks of therapy  [Electronically signed] 03/31/2023 03:23 PM  Virl Diamond MD NPI: [id number]

## 2023-04-24 NOTE — Telephone Encounter (Signed)
I have left numerous messages for this patient with no response.  Closing for lack of communicatioan.

## 2023-07-07 ENCOUNTER — Telehealth: Payer: Self-pay | Admitting: Pulmonary Disease

## 2023-12-07 ENCOUNTER — Encounter (HOSPITAL_COMMUNITY): Payer: Self-pay | Admitting: Internal Medicine

## 2023-12-07 ENCOUNTER — Observation Stay (HOSPITAL_COMMUNITY)
Admission: EM | Admit: 2023-12-07 | Discharge: 2023-12-08 | Discharge status: Against Medical Advice | Payer: Non-veteran care | Attending: Emergency Medicine | Admitting: Emergency Medicine

## 2023-12-07 ENCOUNTER — Other Ambulatory Visit: Payer: Self-pay

## 2023-12-07 DIAGNOSIS — Z23 Encounter for immunization: Secondary | ICD-10-CM | POA: Diagnosis not present

## 2023-12-07 DIAGNOSIS — E871 Hypo-osmolality and hyponatremia: Secondary | ICD-10-CM | POA: Diagnosis not present

## 2023-12-07 DIAGNOSIS — R531 Weakness: Secondary | ICD-10-CM

## 2023-12-07 DIAGNOSIS — Z79899 Other long term (current) drug therapy: Secondary | ICD-10-CM | POA: Diagnosis not present

## 2023-12-07 DIAGNOSIS — Z7984 Long term (current) use of oral hypoglycemic drugs: Secondary | ICD-10-CM | POA: Diagnosis not present

## 2023-12-07 DIAGNOSIS — E11649 Type 2 diabetes mellitus with hypoglycemia without coma: Secondary | ICD-10-CM | POA: Diagnosis present

## 2023-12-07 DIAGNOSIS — F1010 Alcohol abuse, uncomplicated: Secondary | ICD-10-CM | POA: Diagnosis not present

## 2023-12-07 DIAGNOSIS — R5383 Other fatigue: Secondary | ICD-10-CM | POA: Diagnosis present

## 2023-12-07 DIAGNOSIS — E1165 Type 2 diabetes mellitus with hyperglycemia: Principal | ICD-10-CM | POA: Diagnosis present

## 2023-12-07 DIAGNOSIS — F1990 Other psychoactive substance use, unspecified, uncomplicated: Secondary | ICD-10-CM

## 2023-12-07 DIAGNOSIS — G4733 Obstructive sleep apnea (adult) (pediatric): Secondary | ICD-10-CM | POA: Diagnosis present

## 2023-12-07 LAB — BASIC METABOLIC PANEL
Anion gap: 14 (ref 5–15)
Anion gap: 12 (ref 5–15)
Anion gap: 14 (ref 5–15)
Anion gap: 14 (ref 5–15)
BUN: 40 mg/dL — ABNORMAL HIGH (ref 8–23)
BUN: 32 mg/dL — ABNORMAL HIGH (ref 8–23)
BUN: 24 mg/dL — ABNORMAL HIGH (ref 8–23)
BUN: 24 mg/dL — ABNORMAL HIGH (ref 8–23)
CO2: 22 mmol/L (ref 22–32)
CO2: 26 mmol/L (ref 22–32)
CO2: 24 mmol/L (ref 22–32)
CO2: 25 mmol/L (ref 22–32)
Calcium: 8.8 mg/dL — ABNORMAL LOW (ref 8.9–10.3)
Calcium: 8.9 mg/dL (ref 8.9–10.3)
Calcium: 8.2 mg/dL — ABNORMAL LOW (ref 8.9–10.3)
Calcium: 8.8 mg/dL — ABNORMAL LOW (ref 8.9–10.3)
Chloride: 87 mmol/L — ABNORMAL LOW (ref 98–111)
Chloride: 94 mmol/L — ABNORMAL LOW (ref 98–111)
Chloride: 98 mmol/L (ref 98–111)
Chloride: 96 mmol/L — ABNORMAL LOW (ref 98–111)
Creatinine, Ser: 0.97 mg/dL (ref 0.61–1.24)
Creatinine, Ser: 0.86 mg/dL (ref 0.61–1.24)
Creatinine, Ser: 0.72 mg/dL (ref 0.61–1.24)
Creatinine, Ser: 0.85 mg/dL (ref 0.61–1.24)
GFR, Estimated: >60 mL/min (ref >60)
GFR, Estimated: >60 mL/min (ref >60)
GFR, Estimated: >60 mL/min (ref >60)
GFR, Estimated: >60 mL/min (ref >60)
Glucose, Bld: 630 mg/dL (ref 70–99)
Glucose, Bld: 415 mg/dL — ABNORMAL HIGH (ref 70–99)
Glucose, Bld: 300 mg/dL — ABNORMAL HIGH (ref 70–99)
Glucose, Bld: 380 mg/dL — ABNORMAL HIGH (ref 70–99)
Potassium: 4.6 mmol/L (ref 3.5–5.1)
Potassium: 4.3 mmol/L (ref 3.5–5.1)
Potassium: 3.7 mmol/L (ref 3.5–5.1)
Potassium: 4.2 mmol/L (ref 3.5–5.1)
Sodium: 123 mmol/L — ABNORMAL LOW (ref 135–145)
Sodium: 132 mmol/L — ABNORMAL LOW (ref 135–145)
Sodium: 136 mmol/L (ref 135–145)
Sodium: 135 mmol/L (ref 135–145)

## 2023-12-07 LAB — URINALYSIS, ROUTINE W REFLEX MICROSCOPIC
Bilirubin Urine: NEGATIVE
Glucose, UA: >=500 mg/dL — AB
Hgb urine dipstick: NEGATIVE
Ketones, ur: 5 mg/dL — AB
Leukocytes,Ua: NEGATIVE
Nitrite: NEGATIVE
Protein, ur: NEGATIVE mg/dL
Specific Gravity, Urine: 1.023 (ref 1.005–1.030)
pH: 5 (ref 5.0–8.0)

## 2023-12-07 LAB — HEPATIC FUNCTION PANEL
ALT: 44 U/L (ref 0–44)
AST: 15 U/L (ref 15–41)
Albumin: 2.4 g/dL — ABNORMAL LOW (ref 3.5–5.0)
Alkaline Phosphatase: 109 U/L (ref 38–126)
Bilirubin, Direct: <0.1 mg/dL (ref 0.0–0.2)
Total Bilirubin: 0.6 mg/dL (ref 0.0–1.2)
Total Protein: 5.8 g/dL — ABNORMAL LOW (ref 6.5–8.1)

## 2023-12-07 LAB — I-STAT VENOUS BLOOD GAS, ED
Acid-Base Excess: 0 mmol/L (ref 0.0–2.0)
Bicarbonate: 26 mmol/L (ref 20.0–28.0)
Calcium, Ion: 1.17 mmol/L (ref 1.15–1.40)
HCT: 44 % (ref 39.0–52.0)
Hemoglobin: 15 g/dL (ref 13.0–17.0)
O2 Saturation: 63 %
Potassium: 4.6 mmol/L (ref 3.5–5.1)
Sodium: 124 mmol/L — ABNORMAL LOW (ref 135–145)
TCO2: 27 mmol/L (ref 22–32)
pCO2, Ven: 45.4 mm[Hg] (ref 44–60)
pH, Ven: 7.366 (ref 7.25–7.43)
pO2, Ven: 34 mm[Hg] (ref 32–45)

## 2023-12-07 LAB — I-STAT CHEM 8, ED
BUN: 39 mg/dL — ABNORMAL HIGH (ref 8–23)
Calcium, Ion: 1.17 mmol/L (ref 1.15–1.40)
Chloride: 90 mmol/L — ABNORMAL LOW (ref 98–111)
Creatinine, Ser: 0.8 mg/dL (ref 0.61–1.24)
Glucose, Bld: 638 mg/dL (ref 70–99)
HCT: 44 % (ref 39.0–52.0)
Hemoglobin: 15 g/dL (ref 13.0–17.0)
Potassium: 4.7 mmol/L (ref 3.5–5.1)
Sodium: 124 mmol/L — ABNORMAL LOW (ref 135–145)
TCO2: 25 mmol/L (ref 22–32)

## 2023-12-07 LAB — MAGNESIUM: Magnesium: 2.2 mg/dL (ref 1.7–2.4)

## 2023-12-07 LAB — CBC
HCT: 39.2 % (ref 39.0–52.0)
Hemoglobin: 13.4 g/dL (ref 13.0–17.0)
MCH: 29.4 pg (ref 26.0–34.0)
MCHC: 34.2 g/dL (ref 30.0–36.0)
MCV: 86 fL (ref 80.0–100.0)
Platelets: 384 10*3/uL (ref 150–400)
RBC: 4.56 MIL/uL (ref 4.22–5.81)
RDW: 13.2 % (ref 11.5–15.5)
WBC: 17.1 10*3/uL — ABNORMAL HIGH (ref 4.0–10.5)
nRBC: 0 % (ref 0.0–0.2)

## 2023-12-07 LAB — CBG MONITORING, ED
Glucose-Capillary: >600 mg/dL (ref 70–99)
Glucose-Capillary: 479 mg/dL — ABNORMAL HIGH (ref 70–99)
Glucose-Capillary: 294 mg/dL — ABNORMAL HIGH (ref 70–99)
Glucose-Capillary: 393 mg/dL — ABNORMAL HIGH (ref 70–99)

## 2023-12-07 LAB — TROPONIN I (HIGH SENSITIVITY)
Troponin I (High Sensitivity): 11 ng/L (ref <18)
Troponin I (High Sensitivity): 10 ng/L (ref <18)

## 2023-12-07 LAB — HIV ANTIBODY (ROUTINE TESTING W REFLEX): HIV Screen 4th Generation wRfx: NONREACTIVE

## 2023-12-07 LAB — OSMOLALITY: Osmolality: 314 mosm/kg — ABNORMAL HIGH (ref 275–295)

## 2023-12-07 LAB — ETHANOL: Alcohol, Ethyl (B): <10 mg/dL (ref <10)

## 2023-12-07 MED ORDER — INSULIN ASPART 100 UNIT/ML IJ SOLN
0.0000 [IU] | Freq: Three times a day (TID) | INTRAMUSCULAR | Status: DC
  Administered 2023-12-08: 11 [IU] via SUBCUTANEOUS

## 2023-12-07 MED ORDER — HYDRALAZINE HCL 20 MG/ML IJ SOLN: 5.0000 mg | INTRAMUSCULAR | Status: DC | PRN

## 2023-12-07 MED ORDER — INSULIN REGULAR(HUMAN) IN NACL 100-0.9 UT/100ML-% IV SOLN: INTRAVENOUS | Status: DC

## 2023-12-07 MED ORDER — LACTATED RINGERS IV BOLUS
20.0000 mL/kg | Freq: Once | INTRAVENOUS | Status: AC
  Administered 2023-12-07: 1632 mL via INTRAVENOUS

## 2023-12-07 MED ORDER — PANTOPRAZOLE SODIUM 40 MG IV SOLR
40.0000 mg | Freq: Two times a day (BID) | INTRAVENOUS | Status: DC
  Administered 2023-12-07 – 2023-12-08 (×2): 40 mg via INTRAVENOUS
  Filled 2023-12-07 (×3): qty 10

## 2023-12-07 MED ORDER — DEXTROSE IN LACTATED RINGERS 5 % IV SOLN: INTRAVENOUS | Status: DC

## 2023-12-07 MED ORDER — NICOTINE 14 MG/24HR TD PT24: 14.0000 mg | MEDICATED_PATCH | Freq: Every day | TRANSDERMAL | Status: DC

## 2023-12-07 MED ORDER — DEXTROSE 50 % IV SOLN: 0.0000 mL | INTRAVENOUS | Status: DC | PRN

## 2023-12-07 MED ORDER — INFLUENZA VAC A&B SURF ANT ADJ 0.5 ML IM SUSY
0.5000 mL | PREFILLED_SYRINGE | INTRAMUSCULAR | Status: AC
  Administered 2023-12-08: 0.5 mL via INTRAMUSCULAR
  Filled 2023-12-07 (×2): qty 0.5

## 2023-12-07 MED ORDER — THIAMINE HCL 100 MG/ML IJ SOLN
500.0000 mg | Freq: Every day | INTRAVENOUS | Status: DC
  Administered 2023-12-07 – 2023-12-08 (×2): 500 mg via INTRAVENOUS
  Filled 2023-12-07 (×2): qty 5

## 2023-12-07 MED ORDER — POTASSIUM CHLORIDE 10 MEQ/100ML IV SOLN: 10.0000 meq | INTRAVENOUS | Status: AC

## 2023-12-07 MED ORDER — ACETAMINOPHEN 650 MG RE SUPP: 650.0000 mg | Freq: Four times a day (QID) | RECTAL | Status: DC | PRN

## 2023-12-07 MED ORDER — SODIUM CHLORIDE 0.9 % IV SOLN: INTRAVENOUS | Status: DC

## 2023-12-07 MED ORDER — ACETAMINOPHEN 325 MG PO TABS: 650.0000 mg | ORAL_TABLET | Freq: Four times a day (QID) | ORAL | Status: DC | PRN

## 2023-12-07 MED ORDER — SODIUM CHLORIDE 0.9% FLUSH
3.0000 mL | Freq: Two times a day (BID) | INTRAVENOUS | Status: DC
  Administered 2023-12-07 – 2023-12-08 (×3): 3 mL via INTRAVENOUS

## 2023-12-07 MED ORDER — LACTATED RINGERS IV SOLN: INTRAVENOUS | Status: DC

## 2023-12-07 MED ORDER — HEPARIN SODIUM (PORCINE) 5000 UNIT/ML IJ SOLN
5000.0000 [IU] | Freq: Two times a day (BID) | INTRAMUSCULAR | Status: DC
  Administered 2023-12-07 – 2023-12-08 (×2): 5000 [IU] via SUBCUTANEOUS
  Filled 2023-12-07 (×2): qty 1

## 2023-12-07 MED ORDER — INSULIN ASPART 100 UNIT/ML IJ SOLN: 0.0000 [IU] | Freq: Three times a day (TID) | INTRAMUSCULAR | Status: DC

## 2023-12-07 NOTE — H&P (Signed)
 History and Physical    Patient: Marcus Sanders:[id number] DOB: 09/15/1961 DOA: 12/07/2023 DOS: the patient was seen and examined on 12/07/2023 PCP: [facility]  Patient coming from: Home Chief complaint: Chief Complaint  Patient presents with   Fatigue   HPI:  Marcus Sanders is a 63 y.o. male with past medical history  of diabetes mellitus type 2, gall abuse, ADHD, depression, GERD, hyperlipidemia presenting to the emergency room with hyperglycemia with sugars of over 600.  Patient reports malaise weakness fatigue and stumbling various drug items noted on the scene when EMS arrived.  Patient drinks about 2 beers a day and last drink was last night.  No reports of fevers chills shortness of breath nausea vomiting diarrhea bleeding seizures or falls.  No reports or history of alcohol withdrawal.    >>ED Course: In emergency room alert awake oriented.  Afebrile temperature 99.7 O2 sats 97% room air. Vitals:   12/07/23 1218 12/07/23 1232 12/07/23 1235  BP:  132/81   Pulse:  99   Temp:   99.7 F (37.6 C)  Height: 6\' 4"  (1.93 m)    Weight: 81.6 kg    SpO2:  97%   TempSrc:   Oral  BMI (Calculated): 21.92    EKG shows sinus rhythm 95 PR of 150 QTc of 458, and a right bundle branch block. ED evaluation  so far shows: Capillary blood glucose of 479. Metabolic panel showing hyponatremia of 123 glucose 630 BUN 49 calcium 8.8 serum awesome of 314.  Carbon anion gap within normal limits. Hepatic function added on and pending Ethanol level added on and pending. CBC shows leukocytosis of 17.1 hemoglobin 13.4 platelets of 384. Urinalysis shows glucose more than 500 0-5 WBCs and RBCs.   In the emergency room  pt has received the following treatment thus far: Medications  lactated ringers  infusion (has no administration in time range)  dextrose  5 % in lactated ringers  infusion (has no administration in time range)  dextrose  50 % solution 0-50 mL (has no administration in time range)   insulin  regular, human (MYXREDLIN) 100 units/ 100 mL infusion (has no administration in time range)  potassium chloride  10 mEq in 100 mL IVPB (has no administration in time range)  lactated ringers  bolus 1,632 mL (1,632 mLs Intravenous New Bag/Given 12/07/23 1258)   Review of Systems  Constitutional:  Positive for malaise/fatigue.  Neurological:  Positive for weakness.   Past Medical History:  Diagnosis Date   ADHD (attention deficit hyperactivity disorder)    Arthritis    Complication of anesthesia    if not under deep enough , I start snorning   Depression    Diabetes mellitus without complication (HCC)    GERD (gastroesophageal reflux disease)    HLD (hyperlipidemia)    Past Surgical History:  Procedure Laterality Date   DRUG INDUCED ENDOSCOPY Bilateral 06/18/2022   Procedure: DRUG INDUCED ENDOSCOPY;  Surgeon: Virgina Grills, MD;  Location: [facility] OR;  Service: ENT;  Laterality: Bilateral;   IMPLANTATION OF HYPOGLOSSAL NERVE STIMULATOR Right 08/13/2022   Procedure: IMPLANTATION OF HYPOGLOSSAL NERVE STIMULATOR;  Surgeon: Virgina Grills, MD;  Location: [facility];  Service: ENT;  Laterality: Right;    reports that he has never smoked. He has never used smokeless tobacco. He reports current alcohol use of about 14.0 standard drinks of alcohol per week. He reports current drug use. Frequency: 7.00 times per week. Drug: Marijuana.  No Known Allergies  No family history on file.  Prior to Admission medications   Medication Sig Start Date End Date Taking? Authorizing Provider  atorvastatin (LIPITOR) 40 MG tablet Take 40 mg by mouth every evening.    [provider]  gabapentin (NEURONTIN) 300 MG capsule Take 600 mg by mouth in the morning, at noon, and at bedtime.    [provider]  HYDROcodone -acetaminophen  (NORCO) 5-325 MG tablet Take 1-2 tablets by mouth every 6 (six) hours as needed for moderate pain. 08/13/22   Virgina Grills, MD  metFORMIN  (GLUCOPHAGE) 500 MG tablet Take 500 mg by mouth in the morning and at bedtime.    [provider]  omeprazole (PRILOSEC OTC) 20 MG tablet Take 20 mg by mouth daily as needed.    [provider]   Vitals:   12/07/23 1218 12/07/23 1232 12/07/23 1235  BP:  132/81   Pulse:  99   Temp:   99.7 F (37.6 C)  TempSrc:   Oral  SpO2:  97%   Weight: 81.6 kg    Height: 6\' 4"  (1.93 m)     Physical Exam Vitals and nursing note reviewed.  Constitutional:      General: He is not in acute distress. HENT:     Head: Normocephalic and atraumatic.     Right Ear: Hearing normal.     Left Ear: Hearing normal.     Nose: Nose normal. No nasal deformity.     Mouth/Throat:     Lips: Pink.     Tongue: No lesions.     Pharynx: Oropharynx is clear.  Eyes:     General: Lids are normal.     Extraocular Movements: Extraocular movements intact.  Cardiovascular:     Rate and Rhythm: Normal rate and regular rhythm.     Heart sounds: Normal heart sounds.  Pulmonary:     Effort: Pulmonary effort is normal.     Breath sounds: Normal breath sounds.  Abdominal:     General: Bowel sounds are normal. There is no distension.     Palpations: Abdomen is soft. There is no mass.     Tenderness: There is no abdominal tenderness.  Musculoskeletal:     Right lower leg: No edema.     Left lower leg: No edema.  Skin:    General: Skin is warm.  Neurological:     General: No focal deficit present.     Mental Status: He is alert and oriented to person, place, and time.     Cranial Nerves: Cranial nerves 2-12 are intact.  Psychiatric:        Attention and Perception: Attention normal.        Mood and Affect: Mood normal.        Speech: Speech normal.        Behavior: Behavior normal. Behavior is cooperative.      Labs on Admission: I have personally reviewed following labs and imaging studies Results for orders placed or performed during the hospital encounter of 12/07/23 (from the past 24 hours)   Urinalysis, Routine w reflex microscopic -Urine, Clean Catch     Status: Abnormal   Collection Time: 12/07/23 12:22 PM  Result Value Ref Range   Color, Urine STRAW (A) YELLOW   APPearance CLEAR CLEAR   Specific Gravity, Urine 1.023 1.005 - 1.030   pH 5.0 5.0 - 8.0   Glucose, UA >=500 (A) NEGATIVE mg/dL   Hgb urine dipstick NEGATIVE NEGATIVE   Bilirubin Urine NEGATIVE NEGATIVE   Ketones, ur 5 (A) NEGATIVE  mg/dL   Protein, ur NEGATIVE NEGATIVE mg/dL   Nitrite NEGATIVE NEGATIVE   Leukocytes,Ua NEGATIVE NEGATIVE   RBC / HPF 0-5 0 - 5 RBC/hpf   WBC, UA 0-5 0 - 5 WBC/hpf   Bacteria, UA RARE (A) NONE SEEN   Squamous Epithelial / HPF 0-5 0 - 5 /HPF  CBG monitoring, ED     Status: Abnormal   Collection Time: 12/07/23 12:31 PM  Result Value Ref Range   Glucose-Capillary >600 (HH) 70 - 99 mg/dL  Basic metabolic panel     Status: Abnormal   Collection Time: 12/07/23 12:43 PM  Result Value Ref Range   Sodium 123 (L) 135 - 145 mmol/L   Potassium 4.6 3.5 - 5.1 mmol/L   Chloride 87 (L) 98 - 111 mmol/L   CO2 22 22 - 32 mmol/L   Glucose, Bld 630 (HH) 70 - 99 mg/dL   BUN 40 (H) 8 - 23 mg/dL   Creatinine, Ser 2.95 0.61 - 1.24 mg/dL   Calcium 8.8 (L) 8.9 - 10.3 mg/dL   GFR, Estimated >28 >41 mL/min   Anion gap 14 5 - 15  Osmolality     Status: Abnormal   Collection Time: 12/07/23 12:43 PM  Result Value Ref Range   Osmolality 314 (H) 275 - 295 mOsm/kg  CBC     Status: Abnormal   Collection Time: 12/07/23 12:50 PM  Result Value Ref Range   WBC 17.1 (H) 4.0 - 10.5 K/uL   RBC 4.56 4.22 - 5.81 MIL/uL   Hemoglobin 13.4 13.0 - 17.0 g/dL   HCT 32.4 40.1 - 02.7 %   MCV 86.0 80.0 - 100.0 fL   MCH 29.4 26.0 - 34.0 pg   MCHC 34.2 30.0 - 36.0 g/dL   RDW 25.3 66.4 - 40.3 %   Platelets 384 150 - 400 K/uL   nRBC 0.0 0.0 - 0.2 %  I-stat chem 8, ed     Status: Abnormal   Collection Time: 12/07/23 12:53 PM  Result Value Ref Range   Sodium 124 (L) 135 - 145 mmol/L   Potassium 4.7 3.5 - 5.1 mmol/L    Chloride 90 (L) 98 - 111 mmol/L   BUN 39 (H) 8 - 23 mg/dL   Creatinine, Ser 4.74 0.61 - 1.24 mg/dL   Glucose, Bld 259 (HH) 70 - 99 mg/dL   Calcium, Ion 5.63 8.75 - 1.40 mmol/L   TCO2 25 22 - 32 mmol/L   Hemoglobin 15.0 13.0 - 17.0 g/dL   HCT 64.3 32.9 - 51.8 %   Comment NOTIFIED PHYSICIAN   I-Stat venous blood gas, ED     Status: Abnormal   Collection Time: 12/07/23  1:05 PM  Result Value Ref Range   pH, Ven 7.366 7.25 - 7.43   pCO2, Ven 45.4 44 - 60 mmHg   pO2, Ven 34 32 - 45 mmHg   Bicarbonate 26.0 20.0 - 28.0 mmol/L   TCO2 27 22 - 32 mmol/L   O2 Saturation 63 %   Acid-Base Excess 0.0 0.0 - 2.0 mmol/L   Sodium 124 (L) 135 - 145 mmol/L   Potassium 4.6 3.5 - 5.1 mmol/L   Calcium, Ion 1.17 1.15 - 1.40 mmol/L   HCT 44.0 39.0 - 52.0 %   Hemoglobin 15.0 13.0 - 17.0 g/dL   Sample type VENOUS    Comment NOTIFIED PHYSICIAN   CBG monitoring, ED     Status: Abnormal   Collection Time: 12/07/23  2:28 PM  Result Value Ref  Range   Glucose-Capillary 479 (H) 70 - 99 mg/dL   No results found for this or any previous visit (from the past 720 hours). CBC:    Latest Ref Rng & Units 12/07/2023    1:05 PM 12/07/2023   12:53 PM 12/07/2023   12:50 PM  CBC  WBC 4.0 - 10.5 K/uL   17.1   Hemoglobin 13.0 - 17.0 g/dL 02.7  25.3  66.4   Hematocrit 39.0 - 52.0 % 44.0  44.0  39.2   Platelets 150 - 400 K/uL   384    Basic Metabolic Panel: Recent Labs  Lab 12/07/23 1243 12/07/23 1253 12/07/23 1305  NA 123* 124* 124*  K 4.6 4.7 4.6  CL 87* 90*  --   CO2 22  --   --   GLUCOSE 630* 638*  --   BUN 40* 39*  --   CREATININE 0.97 0.80  --   CALCIUM 8.8*  --   --    Creatinine: Lab Results  Component Value Date   CREATININE 0.80 12/07/2023   CREATININE 0.97 12/07/2023   CREATININE 1.00 08/04/2022   Liver Function Tests:    Latest Ref Rng & Units 05/10/2018   12:10 PM  Hepatic Function  Total Protein 6.5 - 8.1 g/dL 6.9   Albumin 3.5 - 5.0 g/dL 4.0   AST 15 - 41 U/L 14   ALT 0 - 44 U/L 26    Alk Phosphatase 38 - 126 U/L 92   Total Bilirubin 0.3 - 1.2 mg/dL 0.5      Data Reviewed: Relevant notes from primary care and specialist visits, past discharge summaries as available in EHR, including Care Everywhere. Prior diagnostic testing as pertinent to current admission diagnoses, Updated medications and problem lists for reconciliation ED course, including vitals, labs, imaging, treatment and response to treatment,Triage notes, nursing and pharmacy notes and ED provider's notes Notable results as noted in HPI.Discussed case with EDMD/ ED APP/ or Specialty MD on call and as needed.  >>Assessment and Plan: * Hyperglycemia due to type 2 diabetes mellitus (HCC)    Latest Ref Rng & Units 12/07/2023    1:05 PM 12/07/2023   12:53 PM 12/07/2023   12:43 PM  CMP  Glucose 70 - 99 mg/dL  403  474   BUN 8 - 23 mg/dL  39  40   Creatinine 2.59 - 1.24 mg/dL  5.63  8.75   Sodium 643 - 145 mmol/L 124  124  123   Potassium 3.5 - 5.1 mmol/L 4.6  4.7  4.6   Chloride 98 - 111 mmol/L  90  87   CO2 22 - 32 mmol/L   22   Calcium 8.9 - 10.3 mg/dL   8.8   We will continue patient on insulin  gtt, and aggressive IV fluid hydration. Will obtain A1c and lipid panel. Nutrition counseling.    Hyponatremia Pseudohyponatremia secondary to hyperglycemia and associated dehydration Will monitor with a repeat metabolic panel and continue with IV fluid hydration.  Alcohol abuse Single dose thiamine  500 mg x 1. CIWA.  Aspiration precaution fall precaution. PT eval prior to discharge.  Ethanol level pending.   OSA (obstructive sleep apnea) CPAP home settings.    DVT prophylaxis:  Heparin   Consults:  None  Advance Care Planning:    Code Status: Full Code   Family Communication:  None  Disposition Plan:  Home   Severity of Illness: The appropriate patient status for this patient is INPATIENT. Inpatient status is  judged to be reasonable and necessary in order to provide the required  intensity of service to ensure the patient's safety. The patient's presenting symptoms, physical exam findings, and initial radiographic and laboratory data in the context of their chronic comorbidities is felt to place them at high risk for further clinical deterioration. Furthermore, it is not anticipated that the patient will be medically stable for discharge from the hospital within 2 midnights of admission.   * I certify that at the point of admission it is my clinical judgment that the patient will require inpatient hospital care spanning beyond 2 midnights from the point of admission due to high intensity of service, high risk for further deterioration and high frequency of surveillance required.*  Author: Lavanda Porter, MD 12/07/2023 2:55 PM  For on call review [email or web address].   Unresulted Labs (From admission, onward)     Start     Ordered   12/08/23 0500  Comprehensive metabolic panel  Tomorrow morning,   R        12/07/23 1454   12/08/23 0500  CBC  Tomorrow morning,   R        12/07/23 1454   12/08/23 0500  Magnesium  Tomorrow morning,   R        12/07/23 1454   12/07/23 1455  Basic metabolic panel  ONCE - STAT,   R        12/07/23 1454   12/07/23 1454  Magnesium  Add-on,   AD        12/07/23 1454   12/07/23 1451  Hemoglobin A1c  Once,   R       Comments: To assess prior glycemic control    12/07/23 1454   12/07/23 1451  HIV Antibody (routine testing w rflx)  (HIV Antibody (Routine testing w reflex) panel)  Once,   R        12/07/23 1454   12/07/23 1444  Hepatic function panel  Add-on,   AD        12/07/23 1443   12/07/23 1444  Ethanol  Add-on,   AD        12/07/23 1444   12/07/23 1239  Basic metabolic panel  (Hyperglycemic Hyperosmolar State (HHS))  STAT Now then every 4 hours ,   STAT      12/07/23 1239            Orders Placed This Encounter  Procedures   CBC   Urinalysis, Routine w reflex microscopic -Urine, Clean Catch   Basic metabolic panel   Osmolality    Hepatic function panel   Ethanol   Hemoglobin A1c   HIV Antibody (routine testing w rflx)   Comprehensive metabolic panel   CBC   Magnesium   Magnesium   Basic metabolic panel   Diet Carb Modified Fluid consistency: Thin; Room service appropriate? Yes   ED Cardiac monitoring   Initiate Carrier Fluid Protocol   Notify physician (specify)   If present, discontinue Insulin  Pump after IV Insulin  is initiated.   Do NOT use lab glucose values in [employer].  If CBG meter reads "Critical High", enter 600.   Upon IV fluid bolus completion, place order for STAT BMET (LAB15) and call provider with results.   K+ > 5 mEq/L and/or K+ addressed separately   Apply Diabetes Mellitus Care Plan   STAT CBG when hypoglycemia is suspected. If treated, recheck every 15 minutes after each treatment until CBG >/= 70 mg/dl   Refer to Hypoglycemia  Protocol Sidebar Report for treatment of CBG < 70 mg/dl   No HS correction Insulin    Maintain IV access   Vital signs   Notify physician (specify)   Refer to Sidebar Report Refer to ICU, Med-Surg, Progressive, and Step-Down Mobility Protocol Sidebars   Initiate Adult Central Line Maintenance and Catheter Protocol for patients with central line (CVC, PICC, Port, Hemodialysis, Trialysis)   Daily weights   Intake and Output   Do not place and if present remove PureWick   Initiate Oral Care Protocol   Initiate Carrier Fluid Protocol   RN may order General Admission PRN Orders utilizing "General Admission PRN medications" (through manage orders) for the following patient needs: allergy symptoms (Claritin), cold sores (Carmex), cough (Robitussin DM), eye irritation (Liquifilm Tears), hemorrhoids (Tucks), indigestion (Maalox), minor skin irritation (Hydrocortisone Cream), muscle pain (Ben Gay), nose irritation (saline nasal spray) and sore throat (Chloraseptic spray).   Cardiac Monitoring Continuous x 48 hours Indications for use: Other; Other indications for use:  hyperglycemia/ Cocaine / Abnormal EKG   Clinical institute withdrawal assessment   Bed rest   Swallow screen   Full code   Consult to hospitalist   Pulse oximetry check with vital signs   Oxygen therapy Mode or (Route): Nasal cannula; Liters Per Minute: 2; Keep O2 saturation between: greater than 92 %   CBG monitoring, ED   I-stat chem 8, ed   I-Stat venous blood gas, ED   CBG monitoring, ED   EKG 12-Lead   Insert peripheral IV   Place in observation (patient's expected length of stay will be less than 2 midnights)   Aspiration precautions   Fall precautions

## 2023-12-07 NOTE — Assessment & Plan Note (Signed)
 CPAP home settings.

## 2023-12-07 NOTE — Assessment & Plan Note (Signed)
    Latest Ref Rng & Units 12/07/2023    1:05 PM 12/07/2023   12:53 PM 12/07/2023   12:43 PM  CMP  Glucose 70 - 99 mg/dL  295  621   BUN 8 - 23 mg/dL  39  40   Creatinine 3.08 - 1.24 mg/dL  6.57  8.46   Sodium 962 - 145 mmol/L 124  124  123   Potassium 3.5 - 5.1 mmol/L 4.6  4.7  4.6   Chloride 98 - 111 mmol/L  90  87   CO2 22 - 32 mmol/L   22   Calcium 8.9 - 10.3 mg/dL   8.8   We will continue patient on insulin  gtt, and aggressive IV fluid hydration. Will obtain A1c and lipid panel. Nutrition counseling.

## 2023-12-07 NOTE — ED Triage Notes (Addendum)
   Pt arrived by [facility] able to walk to bed.  4 days ago malaise, fatigue, stumbling (hx toe amputation). Uncontrolled diabetes, not compliant with home metformin.Various drug paraphernalia on scene (including descriptions of white substance and straw). Reports taking flaxiril "not as I am supposed to" and "taking a bunch when I need it". Pt walks around barefooted and feet reported to be in poor condition.  Pt reports drinking 2 beers a day, last drink last night.  CBG read high (more then 600), 20 in L for arm had 500 ns.  BP 142/85 Hr 94 Spo2 96   Denies cardiac history, twelve lead shows right Bundle Branch block.

## 2023-12-07 NOTE — ED Provider Notes (Signed)
 [place] EMERGENCY DEPARTMENT AT [facility] Provider Note   CSN: [id number] Arrival date & time: 12/07/23  1205     History  Chief Complaint  Patient presents with   Fatigue    Marcus Sanders is a 63 y.o. male.  With a history of uncontrolled diabetes who presents to the ED for fatigue.  EMS was called to patient's home given complaint of fatigue malaise.  EMS noted drug paraphernalia on scene and patient admits to intranasal cocaine use.  Reports drinking alcohol and had 2 beers last night.  He has not had any chest pain shortness of breath fevers chills nausea or vomiting.  States he is not compliant with type 2 diabetes regimen  HPI     Home Medications Prior to Admission medications   Medication Sig Start Date End Date Taking? Authorizing Provider  atorvastatin (LIPITOR) 40 MG tablet Take 40 mg by mouth every evening.    [provider]  gabapentin (NEURONTIN) 300 MG capsule Take 600 mg by mouth in the morning, at noon, and at bedtime.    [provider]  HYDROcodone -acetaminophen  (NORCO) 5-325 MG tablet Take 1-2 tablets by mouth every 6 (six) hours as needed for moderate pain. 08/13/22   Virgina Grills, MD  metFORMIN (GLUCOPHAGE) 500 MG tablet Take 500 mg by mouth in the morning and at bedtime.    [provider]  omeprazole (PRILOSEC OTC) 20 MG tablet Take 20 mg by mouth daily as needed.    [provider]      Allergies    Patient has no known allergies.    Review of Systems   Review of Systems  Physical Exam Updated Vital Signs BP 132/81   Pulse 99   Temp 99.7 F (37.6 C) (Oral)   Ht 6\' 4"  (1.93 m)   Wt 81.6 kg   SpO2 97%   BMI 21.91 kg/m  Physical Exam Vitals and nursing note reviewed.  HENT:     Head: Normocephalic and atraumatic.  Eyes:     Pupils: Pupils are equal, round, and reactive to light.  Cardiovascular:     Rate and Rhythm: Normal rate and regular rhythm.  Pulmonary:     Effort: Pulmonary  effort is normal.     Breath sounds: Normal breath sounds.  Abdominal:     Palpations: Abdomen is soft.     Tenderness: There is no abdominal tenderness.  Skin:    General: Skin is warm and dry.  Neurological:     General: No focal deficit present.     Mental Status: He is alert.     Sensory: No sensory deficit.     Motor: No weakness.  Psychiatric:        Mood and Affect: Mood normal.     ED Results / Procedures / Treatments   Labs (all labs ordered are listed, but only abnormal results are displayed) Labs Reviewed  CBC - Abnormal; Notable for the following components:      Result Value   WBC 17.1 (*)    All other components within normal limits  URINALYSIS, ROUTINE W REFLEX MICROSCOPIC - Abnormal; Notable for the following components:   Color, Urine STRAW (*)    Glucose, UA >=500 (*)    Ketones, ur 5 (*)    Bacteria, UA RARE (*)    All other components within normal limits  BASIC METABOLIC PANEL - Abnormal; Notable for the following components:   Sodium 123 (*)    Chloride  87 (*)    Glucose, Bld 630 (*)    BUN 40 (*)    Calcium 8.8 (*)    All other components within normal limits  OSMOLALITY - Abnormal; Notable for the following components:   Osmolality 314 (*)    All other components within normal limits  BASIC METABOLIC PANEL - Abnormal; Notable for the following components:   Sodium 132 (*)    Chloride 94 (*)    Glucose, Bld 415 (*)    BUN 32 (*)    All other components within normal limits  CBG MONITORING, ED - Abnormal; Notable for the following components:   Glucose-Capillary >600 (*)    All other components within normal limits  I-STAT CHEM 8, ED - Abnormal; Notable for the following components:   Sodium 124 (*)    Chloride 90 (*)    BUN 39 (*)    Glucose, Bld 638 (*)    All other components within normal limits  I-STAT VENOUS BLOOD GAS, ED - Abnormal; Notable for the following components:   Sodium 124 (*)    All other components within normal limits   CBG MONITORING, ED - Abnormal; Notable for the following components:   Glucose-Capillary 479 (*)    All other components within normal limits  ETHANOL  MAGNESIUM  BASIC METABOLIC PANEL  BASIC METABOLIC PANEL  BASIC METABOLIC PANEL  HEPATIC FUNCTION PANEL  HEMOGLOBIN A1C  HIV ANTIBODY (ROUTINE TESTING W REFLEX)  CBG MONITORING, ED  TROPONIN I (HIGH SENSITIVITY)  TROPONIN I (HIGH SENSITIVITY)    EKG EKG Interpretation Date/Time:  Monday December 07 2023 13:12:16 EST Ventricular Rate:  95 PR Interval:  150 QRS Duration:  126 QT Interval:  364 QTC Calculation: 458 R Axis:   -66  Text Interpretation: Sinus rhythm RBBB and LAFB Confirmed by Rafael Bun [phone]) on 12/07/2023 2:30:20 PM  Radiology No results found.  Procedures .Critical Care  Performed by: Sallyanne Creamer, DO Authorized by: Sallyanne Creamer, DO   Critical care provider statement:    Critical care time (minutes):  50   Critical care time was exclusive of:  Separately billable procedures and treating other patients and teaching time   Critical care was necessary to treat or prevent imminent or life-threatening deterioration of the following conditions:  Metabolic crisis   Critical care was time spent personally by me on the following activities:  Development of treatment plan with patient or surrogate, discussions with consultants, evaluation of patient's response to treatment, examination of patient, ordering and review of laboratory studies, ordering and review of radiographic studies, ordering and performing treatments and interventions, pulse oximetry, re-evaluation of patient's condition and review of old charts   I assumed direction of critical care for this patient from another provider in my specialty: no     Care discussed with: admitting provider       Medications Ordered in ED Medications  dextrose  5 % in lactated ringers  infusion (0 mLs Intravenous Hold 12/07/23 1533)  dextrose  50 % solution  0-50 mL (has no administration in time range)  potassium chloride  10 mEq in 100 mL IVPB (has no administration in time range)  thiamine  (VITAMIN B1) 500 mg in sodium chloride  0.9 % 50 mL IVPB (has no administration in time range)  heparin  injection 5,000 Units (has no administration in time range)  sodium chloride  flush (NS) 0.9 % injection 3 mL (has no administration in time range)  acetaminophen  (TYLENOL ) tablet 650 mg (has no administration in time range)  Or  acetaminophen  (TYLENOL ) suppository 650 mg (has no administration in time range)  hydrALAZINE  (APRESOLINE ) injection 5 mg (has no administration in time range)  0.9 %  sodium chloride  infusion (has no administration in time range)  pantoprazole  (PROTONIX ) injection 40 mg (has no administration in time range)  insulin  regular, human (MYXREDLIN) 100 units/ 100 mL infusion (has no administration in time range)  lactated ringers  infusion (has no administration in time range)  dextrose  5 % in lactated ringers  infusion (has no administration in time range)  lactated ringers  bolus 1,632 mL (0 mLs Intravenous Stopped 12/07/23 1533)    ED Course/ Medical Decision Making/ A&P Clinical Course as of 12/07/23 1631  Mon Dec 07, 2023  1330 Initial lab work notable for hyperglycemia, potassium of 4.3.  VBG not consistent with DKA more likely HHS.  Will continue IV fluid bolus and start insulin  drip along with potassium repletion.  He remains hemodynamically stable. [MP]  1430 Discussed with admitting hospitalist who has accepted the patient for admission [MP]    Clinical Course User Index [MP] Sallyanne Creamer, DO                                 Medical Decision Making 63 year old male with history as above presenting given concern for general malaise fatigue generalized weakness.  Admits to cocaine use earlier today as well as recent alcohol use.  Afebrile and normotensive on initial assessment.  No significant respiratory distress or  abdominal tenderness on my exam.  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  Initial glucose on point-of-care here was in the 600s.  This presentation is concerning for DKA versus HHS versus acute infectious process.  No overt infectious symptoms.  We will initiate IV fluid bolus and DKA/HHS workup.  Most likely underlying cause of his hyperglycemia would be medication noncompliance coupled with polysubstance use and dehydration.  Amount and/or Complexity of Data Reviewed Labs: ordered.  Risk Prescription drug management. Decision regarding hospitalization.           Final Clinical Impression(s) / ED Diagnoses Final diagnoses:  Uncontrolled type 2 diabetes mellitus with hyperglycemia (HCC)  Polysubstance use disorder  Weakness    Rx / DC Orders ED Discharge Orders     None         Sallyanne Creamer, DO 12/07/23 1632

## 2023-12-07 NOTE — Assessment & Plan Note (Addendum)
 Pseudohyponatremia secondary to hyperglycemia and associated dehydration Will monitor with a repeat metabolic panel and continue with IV fluid hydration.

## 2023-12-07 NOTE — Assessment & Plan Note (Signed)
 Single dose thiamine  500 mg x 1. CIWA.  Aspiration precaution fall precaution. PT eval prior to discharge.  Ethanol level pending.

## 2023-12-08 DIAGNOSIS — E11649 Type 2 diabetes mellitus with hypoglycemia without coma: Secondary | ICD-10-CM

## 2023-12-08 LAB — CBC
HCT: 39.8 % (ref 39.0–52.0)
Hemoglobin: 13.3 g/dL (ref 13.0–17.0)
MCH: 29.5 pg (ref 26.0–34.0)
MCHC: 33.4 g/dL (ref 30.0–36.0)
MCV: 88.2 fL (ref 80.0–100.0)
Platelets: 369 10*3/uL (ref 150–400)
RBC: 4.51 MIL/uL (ref 4.22–5.81)
RDW: 13.2 % (ref 11.5–15.5)
WBC: 11.1 10*3/uL — ABNORMAL HIGH (ref 4.0–10.5)
nRBC: 0 % (ref 0.0–0.2)

## 2023-12-08 LAB — MAGNESIUM: Magnesium: 2.1 mg/dL (ref 1.7–2.4)

## 2023-12-08 LAB — BASIC METABOLIC PANEL
Anion gap: 12 (ref 5–15)
Anion gap: 10 (ref 5–15)
BUN: 21 mg/dL (ref 8–23)
BUN: 19 mg/dL (ref 8–23)
CO2: 25 mmol/L (ref 22–32)
CO2: 25 mmol/L (ref 22–32)
Calcium: 8.8 mg/dL — ABNORMAL LOW (ref 8.9–10.3)
Calcium: 8.4 mg/dL — ABNORMAL LOW (ref 8.9–10.3)
Chloride: 95 mmol/L — ABNORMAL LOW (ref 98–111)
Chloride: 96 mmol/L — ABNORMAL LOW (ref 98–111)
Creatinine, Ser: 0.82 mg/dL (ref 0.61–1.24)
Creatinine, Ser: 0.82 mg/dL (ref 0.61–1.24)
GFR, Estimated: >60 mL/min (ref >60)
GFR, Estimated: >60 mL/min (ref >60)
Glucose, Bld: 336 mg/dL — ABNORMAL HIGH (ref 70–99)
Glucose, Bld: 422 mg/dL — ABNORMAL HIGH (ref 70–99)
Potassium: 4.4 mmol/L (ref 3.5–5.1)
Potassium: 4 mmol/L (ref 3.5–5.1)
Sodium: 132 mmol/L — ABNORMAL LOW (ref 135–145)
Sodium: 131 mmol/L — ABNORMAL LOW (ref 135–145)

## 2023-12-08 LAB — HEMOGLOBIN A1C
Hgb A1c MFr Bld: >15.5 % — ABNORMAL HIGH (ref 4.8–5.6)
Mean Plasma Glucose: >398 mg/dL

## 2023-12-08 LAB — CBG MONITORING, ED: Glucose-Capillary: 331 mg/dL — ABNORMAL HIGH (ref 70–99)

## 2023-12-08 MED ORDER — INSULIN GLARGINE-YFGN 100 UNIT/ML ~~LOC~~ SOLN
20.0000 [IU] | Freq: Two times a day (BID) | SUBCUTANEOUS | Status: DC
  Administered 2023-12-08: 20 [IU] via SUBCUTANEOUS
  Filled 2023-12-08 (×2): qty 0.2

## 2023-12-08 NOTE — ED Notes (Signed)
Pt has left the ED AMA, Previous RN had pt sign AMA, Attending Sira MD made aware.

## 2023-12-08 NOTE — ED Notes (Signed)
Patient was given a pitcher of ice water.

## 2023-12-08 NOTE — Discharge Summary (Signed)
  Physician Discharge Summary   Patient: Marcus Sanders MRN: [id number] DOB: [birth year]  Admit date:     12/07/2023  Discharge date: 12/08/23  Discharge Physician: Baron Hamper    PCP: [facility]     Discharge Diagnoses: Principal Problem:   Hyperglycemia due to type 2 diabetes mellitus (HCC) Active Problems:   Hyponatremia   OSA (obstructive sleep apnea)   Alcohol abuse   Uncontrolled type II diabetes mellitus with hypoglycemia (HCC)  Resolved Problems:   * No resolved hospital problems. [facility] Course: Pt evaluated for hyperglycemia due to DM.He was initially started on an insulin drip and IV fluids. Sugar improved from >600 to 331. He was then started on semglee 20 units sq bid and novolog SS. However, during morning rounds the pt advised that he did not want to stay in the hospital any longer. He was clearly advised that his sugar is still very elevated and that he was not clear for discharge as we are still titrating his insulin. ER staff notified me at 11:35 AM 12/08/2023 that the pt had left the ER AMA.  Pt should follow up with his PCP regarding his uncontrolled DM.  DISCHARGE MEDICATION:   Discharge Exam: Filed Weights   12/07/23 1218  Weight: 81.6 kg   LEFT AMA   Condition at discharge:  Pt left AMA from the ER   The results of significant diagnostics from this hospitalization (including imaging, microbiology, ancillary and laboratory) are listed below for reference.   Imaging Studies: No results found.  Microbiology: No results found for this or any previous visit.  Labs: CBC: Recent Labs  Lab 12/07/23 1250 12/07/23 1253 12/07/23 1305 12/08/23 0449  WBC 17.1*  --   --  11.1*  HGB 13.4 15.0 15.0 13.3  HCT 39.2 44.0 44.0 39.8  MCV 86.0  --   --  88.2  PLT 384  --   --  369   Basic Metabolic Panel: Recent Labs  Lab 12/07/23 1516 12/07/23 1732 12/07/23 2127 12/08/23 0111 12/08/23 0449  NA 132* 136 135 132* 131*  K 4.3 3.7 4.2 4.4 4.0   CL 94* 98 96* 95* 96*  CO2 26 24 25 25 25   GLUCOSE 415* 300* 380* 336* 422*  BUN 32* 24* 24* 21 19  CREATININE 0.86 0.72 0.85 0.82 0.82  CALCIUM 8.9 8.2* 8.8* 8.8* 8.4*  MG 2.2  --   --   --  2.1   Liver Function Tests: Recent Labs  Lab 12/07/23 1732  AST 15  ALT 44  ALKPHOS 109  BILITOT 0.6  PROT 5.8*  ALBUMIN 2.4*   CBG: Recent Labs  Lab 12/07/23 1231 12/07/23 1428 12/07/23 1727 12/07/23 2326 12/08/23 0758  GLUCAP >600* 479* 294* 393* 331*    Discharge time spent: greater than 30 minutes.  Signed: Baron Hamper , MD [facility] 12/08/2023

## 2024-02-01 NOTE — Telephone Encounter (Signed)
 Nothing further needed

## 2024-02-11 ENCOUNTER — Telehealth: Payer: Self-pay | Admitting: Pulmonary Disease

## 2024-02-11 NOTE — Telephone Encounter (Signed)
 Dr. Deanna Expose just fyi
